# Patient Record
Sex: Female | Born: 1980 | Race: White | Hispanic: No | Marital: Single | Smoking: Never smoker
Health system: Southern US, Community
[De-identification: ages and names within clinical notes are randomized; demographics above are authoritative.]

## PROBLEM LIST (undated history)

## (undated) DIAGNOSIS — IMO0002 Reserved for concepts with insufficient information to code with codable children: Secondary | ICD-10-CM

## (undated) DIAGNOSIS — I10 Essential (primary) hypertension: Secondary | ICD-10-CM

## (undated) DIAGNOSIS — K219 Gastro-esophageal reflux disease without esophagitis: Secondary | ICD-10-CM

## (undated) HISTORY — PX: TONSILLECTOMY: SUR1361

## (undated) HISTORY — DX: Essential (primary) hypertension: I10

---

## 2008-12-30 ENCOUNTER — Emergency Department (HOSPITAL_COMMUNITY): Admission: EM | Admit: 2008-12-30 | Discharge: 2008-12-30 | Payer: Self-pay | Admitting: Family Medicine

## 2009-03-16 ENCOUNTER — Emergency Department (HOSPITAL_COMMUNITY): Admission: EM | Admit: 2009-03-16 | Discharge: 2009-03-16 | Payer: Self-pay | Admitting: Family Medicine

## 2009-07-07 ENCOUNTER — Emergency Department (HOSPITAL_BASED_OUTPATIENT_CLINIC_OR_DEPARTMENT_OTHER): Admission: EM | Admit: 2009-07-07 | Discharge: 2009-07-07 | Payer: Self-pay | Admitting: Emergency Medicine

## 2009-07-07 ENCOUNTER — Ambulatory Visit: Payer: Self-pay | Admitting: Radiology

## 2009-07-18 ENCOUNTER — Emergency Department (HOSPITAL_COMMUNITY): Admission: EM | Admit: 2009-07-18 | Discharge: 2009-07-18 | Payer: Self-pay | Admitting: Family Medicine

## 2010-03-23 ENCOUNTER — Ambulatory Visit (HOSPITAL_COMMUNITY): Payer: Self-pay | Admitting: Licensed Clinical Social Worker

## 2010-04-09 ENCOUNTER — Ambulatory Visit (HOSPITAL_COMMUNITY): Payer: Self-pay | Admitting: Licensed Clinical Social Worker

## 2010-04-16 ENCOUNTER — Ambulatory Visit (HOSPITAL_COMMUNITY): Payer: Self-pay | Admitting: Licensed Clinical Social Worker

## 2010-04-22 ENCOUNTER — Ambulatory Visit (HOSPITAL_COMMUNITY): Payer: Self-pay | Admitting: Psychiatry

## 2010-04-23 ENCOUNTER — Ambulatory Visit (HOSPITAL_COMMUNITY): Payer: Self-pay | Admitting: Licensed Clinical Social Worker

## 2010-04-30 ENCOUNTER — Ambulatory Visit (HOSPITAL_COMMUNITY): Payer: Self-pay | Admitting: Licensed Clinical Social Worker

## 2010-06-23 ENCOUNTER — Encounter (HOSPITAL_COMMUNITY): Payer: Self-pay | Admitting: Physician Assistant

## 2010-06-23 LAB — CBC: Platelets: 254 K/µL (ref 150–399)

## 2010-06-23 LAB — ABO/RH: RH Type: NEGATIVE

## 2010-06-23 LAB — HEPATITIS B SURFACE ANTIGEN: Hepatitis B Surface Ag: NEGATIVE

## 2010-06-23 LAB — RUBELLA ANTIBODY, IGM: Rubella: NON-IMMUNE/NOT IMMUNE

## 2010-08-03 LAB — POCT URINALYSIS DIP (DEVICE)
Ketones, ur: NEGATIVE mg/dL
Urobilinogen, UA: 0.2 mg/dL (ref 0.0–1.0)
pH: 7.5 (ref 5.0–8.0)

## 2011-01-31 ENCOUNTER — Encounter (HOSPITAL_COMMUNITY): Payer: Self-pay | Admitting: Anesthesiology

## 2011-01-31 ENCOUNTER — Inpatient Hospital Stay (HOSPITAL_COMMUNITY)
Admission: AD | Admit: 2011-01-31 | Discharge: 2011-02-02 | DRG: 775 | Disposition: A | Discharge status: Home / Self Care | Payer: 59 | Source: Ambulatory Visit | Attending: Obstetrics and Gynecology | Admitting: Obstetrics and Gynecology

## 2011-01-31 ENCOUNTER — Inpatient Hospital Stay (HOSPITAL_COMMUNITY): Payer: 59 | Admitting: Anesthesiology

## 2011-01-31 ENCOUNTER — Encounter (HOSPITAL_COMMUNITY): Payer: Self-pay | Admitting: *Deleted

## 2011-01-31 DIAGNOSIS — IMO0002 Reserved for concepts with insufficient information to code with codable children: Secondary | ICD-10-CM

## 2011-01-31 HISTORY — DX: Reserved for concepts with insufficient information to code with codable children: IMO0002

## 2011-01-31 HISTORY — DX: Gastro-esophageal reflux disease without esophagitis: K21.9

## 2011-01-31 LAB — CBC
HCT: 39.1 % (ref 36.0–46.0)
Hemoglobin: 13.2 g/dL (ref 12.0–15.0)
MCH: 30.3 pg (ref 26.0–34.0)
MCV: 89.7 fL (ref 78.0–100.0)
Platelets: 244 10*3/uL (ref 150–400)
RBC: 4.36 MIL/uL (ref 3.87–5.11)
WBC: 17.9 10*3/uL — ABNORMAL HIGH (ref 4.0–10.5)

## 2011-01-31 LAB — AMNISURE RUPTURE OF MEMBRANE (ROM) NOT AT ARMC: Amnisure ROM: POSITIVE

## 2011-01-31 MED ORDER — LACTATED RINGERS IV SOLN
INTRAVENOUS | Status: DC
  Administered 2011-01-31 – 2011-02-01 (×3): via INTRAVENOUS

## 2011-01-31 MED ORDER — BUTORPHANOL TARTRATE 2 MG/ML IJ SOLN
1.0000 mg | INTRAMUSCULAR | Status: DC | PRN
  Administered 2011-01-31 (×3): 1 mg via INTRAVENOUS
  Filled 2011-01-31 (×2): qty 1

## 2011-01-31 MED ORDER — FLEET ENEMA 7-19 GM/118ML RE ENEM: 1.0000 | ENEMA | RECTAL | Status: DC | PRN

## 2011-01-31 MED ORDER — FENTANYL 2.5 MCG/ML BUPIVACAINE 1/10 % EPIDURAL INFUSION (WH - ANES)
INTRAMUSCULAR | Status: DC | PRN
  Administered 2011-01-31: 16 mL/h via EPIDURAL

## 2011-01-31 MED ORDER — PHENYLEPHRINE 40 MCG/ML (10ML) SYRINGE FOR IV PUSH (FOR BLOOD PRESSURE SUPPORT)
80.0000 ug | PREFILLED_SYRINGE | INTRAVENOUS | Status: DC | PRN
  Filled 2011-01-31: qty 5

## 2011-01-31 MED ORDER — SODIUM CHLORIDE 0.9 % IV SOLN
2.0000 g | Freq: Four times a day (QID) | INTRAVENOUS | Status: DC
  Administered 2011-01-31 (×2): 2 g via INTRAVENOUS
  Filled 2011-01-31 (×5): qty 2000

## 2011-01-31 MED ORDER — LIDOCAINE HCL 1.5 % IJ SOLN
INTRAMUSCULAR | Status: DC | PRN
  Administered 2011-01-31 (×2): 5 mL via EPIDURAL

## 2011-01-31 MED ORDER — TERBUTALINE SULFATE 1 MG/ML IJ SOLN: 0.2500 mg | Freq: Once | INTRAMUSCULAR | Status: AC | PRN

## 2011-01-31 MED ORDER — OXYCODONE-ACETAMINOPHEN 5-325 MG PO TABS: 2.0000 | ORAL_TABLET | ORAL | Status: DC | PRN

## 2011-01-31 MED ORDER — IBUPROFEN 600 MG PO TABS: 600.0000 mg | ORAL_TABLET | Freq: Four times a day (QID) | ORAL | Status: DC | PRN

## 2011-01-31 MED ORDER — PHENYLEPHRINE 40 MCG/ML (10ML) SYRINGE FOR IV PUSH (FOR BLOOD PRESSURE SUPPORT)
80.0000 ug | PREFILLED_SYRINGE | INTRAVENOUS | Status: DC | PRN
  Filled 2011-01-31 (×2): qty 5

## 2011-01-31 MED ORDER — CITRIC ACID-SODIUM CITRATE 334-500 MG/5ML PO SOLN: 30.0000 mL | ORAL | Status: DC | PRN

## 2011-01-31 MED ORDER — EPHEDRINE 5 MG/ML INJ
10.0000 mg | INTRAVENOUS | Status: DC | PRN
  Filled 2011-01-31: qty 4

## 2011-01-31 MED ORDER — LACTATED RINGERS IV SOLN: 500.0000 mL | INTRAVENOUS | Status: DC | PRN

## 2011-01-31 MED ORDER — DIPHENHYDRAMINE HCL 50 MG/ML IJ SOLN: 12.5000 mg | INTRAMUSCULAR | Status: DC | PRN

## 2011-01-31 MED ORDER — SODIUM CHLORIDE 0.9 % IV SOLN
2.0000 g | Freq: Once | INTRAVENOUS | Status: AC
  Administered 2011-01-31: 2 g via INTRAVENOUS
  Filled 2011-01-31: qty 2000

## 2011-01-31 MED ORDER — LACTATED RINGERS IV SOLN: 500.0000 mL | Freq: Once | INTRAVENOUS | Status: DC

## 2011-01-31 MED ORDER — ACETAMINOPHEN 325 MG PO TABS
650.0000 mg | ORAL_TABLET | ORAL | Status: DC | PRN
  Administered 2011-01-31 (×2): 650 mg via ORAL
  Filled 2011-01-31 (×2): qty 2

## 2011-01-31 MED ORDER — EPHEDRINE 5 MG/ML INJ
10.0000 mg | INTRAVENOUS | Status: DC | PRN
  Administered 2011-01-31: 15 mg via INTRAVENOUS
  Filled 2011-01-31 (×2): qty 4

## 2011-01-31 MED ORDER — OXYTOCIN 20 UNITS IN LACTATED RINGERS INFUSION - SIMPLE
125.0000 mL/h | Freq: Once | INTRAVENOUS | Status: DC
  Filled 2011-01-31: qty 1000

## 2011-01-31 MED ORDER — LIDOCAINE HCL (PF) 1 % IJ SOLN
30.0000 mL | INTRAMUSCULAR | Status: DC | PRN
  Administered 2011-02-01: 30 mL via SUBCUTANEOUS
  Filled 2011-01-31: qty 30

## 2011-01-31 MED ORDER — OXYTOCIN BOLUS FROM INFUSION
500.0000 mL | Freq: Once | INTRAVENOUS | Status: DC
  Filled 2011-01-31: qty 500

## 2011-01-31 MED ORDER — FENTANYL 2.5 MCG/ML BUPIVACAINE 1/10 % EPIDURAL INFUSION (WH - ANES)
14.0000 mL/h | INTRAMUSCULAR | Status: DC
  Administered 2011-01-31 (×4): 14 mL/h via EPIDURAL
  Filled 2011-01-31 (×5): qty 60

## 2011-01-31 MED ORDER — SODIUM CHLORIDE 0.9 % IV SOLN: 1.0000 g | Freq: Four times a day (QID) | INTRAVENOUS | Status: DC

## 2011-01-31 MED ORDER — OXYTOCIN 20 UNITS IN LACTATED RINGERS INFUSION - SIMPLE
1.0000 m[IU]/min | INTRAVENOUS | Status: DC
  Administered 2011-01-31: 1 m[IU]/min via INTRAVENOUS
  Administered 2011-02-01: 999 m[IU]/min via INTRAVENOUS

## 2011-01-31 MED ORDER — ONDANSETRON HCL 4 MG/2ML IJ SOLN
4.0000 mg | Freq: Four times a day (QID) | INTRAMUSCULAR | Status: DC | PRN
  Administered 2011-01-31 (×2): 4 mg via INTRAVENOUS
  Filled 2011-01-31 (×2): qty 2

## 2011-01-31 NOTE — Progress Notes (Signed)
Assumed care of pt after report received.

## 2011-01-31 NOTE — Progress Notes (Signed)
FHT reactive UCs q 2-4 min Cx 3/90/-2 Check cx in 2 hrs

## 2011-01-31 NOTE — H&P (Signed)
Sandra [name] is a 30 y.o. female presenting for SROM at about 4 am, now with some UCs. No HA, vision change, epigastric pain. Maternal Medical History:  Reason for admission: Reason for admission: rupture of membranes.  Contractions: Onset was 3-5 hours ago.   Perceived severity is moderate.    Fetal activity: Perceived fetal activity is normal.      OB History    Grav Para Term Preterm Abortions TAB SAB Ect Mult Living   1              Past Medical History  Diagnosis Date  . GERD (gastroesophageal reflux disease)   . SROM (spontaneous rupture of membranes) 01/31/2011   Past Surgical History  Procedure Date  . Tonsillectomy    Family History: family history is not on file. Social History:  reports that she has never smoked. She has never used smokeless tobacco. She reports that she does not drink alcohol or use illicit drugs.  Review of Systems  Eyes: Negative for blurred vision.  Gastrointestinal: Negative for abdominal pain.  Neurological: Negative for headaches.    Dilation: 2 Effacement (%): 80 Station: -2 Exam by:: Dr. Henderson Cloud Blood pressure 134/79, pulse 91, temperature 98.7 F (37.1 C), temperature source Oral, resp. rate 18, height 5\' 11"  (1.803 m), weight 99.791 kg (220 lb), last menstrual period 04/29/2010. Maternal Exam:  Uterine Assessment: Contraction strength is firm.  Contraction frequency is irregular.      Fetal Exam Fetal Monitor Review: Pattern: accelerations present.       Physical Exam  Constitutional: She appears well-developed and well-nourished.  Cardiovascular: Normal rate and regular rhythm.   Respiratory: Effort normal and breath sounds normal.  GI: There is no tenderness.  Neurological: She has normal reflexes.    Prenatal labs: ABO, Rh:   Antibody:   Rubella:   RPR:    HBsAg:    HIV:    GBS:     Assessment/Plan: 30 yo WF G1P0 at 14 4/7 weeks with SROM. Will begin pitocin prn in 1 hour  Arilla Hice II,Antaeus Karel E 01/31/2011,  8:15 AM

## 2011-01-31 NOTE — Progress Notes (Signed)
Pt reports uc's for the last 3 days. Pt reports vag. Bleeding about 04:00

## 2011-01-31 NOTE — Progress Notes (Signed)
Notified of amnisure positive and SVE. Order to admit pt to L&D

## 2011-01-31 NOTE — Anesthesia Procedure Notes (Signed)
Epidural Patient location during procedure: OB Start time: 01/31/2011 1:20 PM  Staffing Anesthesiologist: Meliah Appleman A. Performed by: anesthesiologist   Preanesthetic Checklist Completed: patient identified, site marked, surgical consent, pre-op evaluation, timeout performed, IV checked, risks and benefits discussed and monitors and equipment checked  Epidural Patient position: sitting Prep: site prepped and draped and DuraPrep Patient monitoring: continuous pulse ox and blood pressure Approach: midline Injection technique: LOR air  Needle:  Needle type: Tuohy  Needle gauge: 17 G Needle length: 9 cm Needle insertion depth: 5 cm cm Catheter type: closed end flexible Catheter size: 19 Gauge Catheter at skin depth: 10 cm Test dose: negative and 1.5% lidocaine  Assessment Events: blood not aspirated, injection not painful, no injection resistance, negative IV test and no paresthesia  Additional Notes Patient is more comfortable after epidural dosed. Please see RN's note for documentation of vital signs and FHR which are stable.

## 2011-01-31 NOTE — Anesthesia Preprocedure Evaluation (Addendum)
Anesthesia Evaluation  Name, MR# and DOB Patient awake  General Assessment Comment  Reviewed: Allergy & Precautions, H&P , Patient's Chart, lab work & pertinent test results  Airway Mallampati: I TM Distance: >3 FB Neck ROM: full    Dental No notable dental hx. (+) Teeth Intact   Pulmonary  clear to auscultation  pulmonary exam normalPulmonary Exam Normal breath sounds clear to auscultation none    Cardiovascular regular Normal    Neuro/Psych Negative Neurological ROS  Negative Psych ROS  GI/Hepatic/Renal negative GI ROS  negative Liver ROS  negative Renal ROS        Endo/Other  Negative Endocrine ROS (+)      Abdominal   Musculoskeletal   Hematology negative hematology ROS (+)   Peds  Reproductive/Obstetrics (+) Pregnancy    Anesthesia Other Findings            Anesthesia Physical Anesthesia Plan  ASA: II  Anesthesia Plan: Epidural   Post-op Pain Management:    Induction:   Airway Management Planned:   Additional Equipment:   Intra-op Plan:   Post-operative Plan:   Informed Consent: I have reviewed the patients History and Physical, chart, labs and discussed the procedure including the risks, benefits and alternatives for the proposed anesthesia with the patient or authorized representative who has indicated his/her understanding and acceptance.     Plan Discussed with: Anesthesiologist  Anesthesia Plan Comments:         Anesthesia Quick Evaluation

## 2011-01-31 NOTE — Progress Notes (Signed)
FHT reactive UCs q 3-4 minutes Cx 3-4/90/-2 IUPC placed-gush of mec stained fluid-thin Epidural in Pitocin on D/W pt meconium

## 2011-02-01 ENCOUNTER — Encounter (HOSPITAL_COMMUNITY): Payer: Self-pay | Admitting: Obstetrics and Gynecology

## 2011-02-01 LAB — ABO/RH: ABO/RH(D): O NEG

## 2011-02-01 MED ORDER — LANOLIN HYDROUS EX OINT: TOPICAL_OINTMENT | CUTANEOUS | Status: DC | PRN

## 2011-02-01 MED ORDER — DIPHENHYDRAMINE HCL 25 MG PO CAPS: 25.0000 mg | ORAL_CAPSULE | Freq: Four times a day (QID) | ORAL | Status: DC | PRN

## 2011-02-01 MED ORDER — ONDANSETRON HCL 4 MG PO TABS
4.0000 mg | ORAL_TABLET | ORAL | Status: DC | PRN
  Administered 2011-02-01: 4 mg via ORAL
  Filled 2011-02-01: qty 1

## 2011-02-01 MED ORDER — ONDANSETRON HCL 4 MG/2ML IJ SOLN: 4.0000 mg | INTRAMUSCULAR | Status: DC | PRN

## 2011-02-01 MED ORDER — SENNOSIDES-DOCUSATE SODIUM 8.6-50 MG PO TABS
2.0000 | ORAL_TABLET | Freq: Every day | ORAL | Status: DC
  Administered 2011-02-01: 2 via ORAL

## 2011-02-01 MED ORDER — BENZOCAINE-MENTHOL 20-0.5 % EX AERO
INHALATION_SPRAY | CUTANEOUS | Status: AC
  Administered 2011-02-01: 1 via TOPICAL
  Filled 2011-02-01: qty 56

## 2011-02-01 MED ORDER — OXYCODONE-ACETAMINOPHEN 5-325 MG PO TABS
1.0000 | ORAL_TABLET | ORAL | Status: DC | PRN
  Administered 2011-02-01 – 2011-02-02 (×4): 1 via ORAL
  Administered 2011-02-02: 2 via ORAL
  Filled 2011-02-01 (×4): qty 1
  Filled 2011-02-01: qty 2

## 2011-02-01 MED ORDER — WITCH HAZEL-GLYCERIN EX PADS: 1.0000 "application " | MEDICATED_PAD | CUTANEOUS | Status: DC | PRN

## 2011-02-01 MED ORDER — BISACODYL 10 MG RE SUPP: 10.0000 mg | Freq: Every day | RECTAL | Status: DC | PRN

## 2011-02-01 MED ORDER — BENZOCAINE-MENTHOL 20-0.5 % EX AERO
1.0000 "application " | INHALATION_SPRAY | CUTANEOUS | Status: DC | PRN
  Administered 2011-02-01: 1 via TOPICAL

## 2011-02-01 MED ORDER — ZOLPIDEM TARTRATE 5 MG PO TABS: 5.0000 mg | ORAL_TABLET | Freq: Every evening | ORAL | Status: DC | PRN

## 2011-02-01 MED ORDER — SIMETHICONE 80 MG PO CHEW: 80.0000 mg | CHEWABLE_TABLET | ORAL | Status: DC | PRN

## 2011-02-01 MED ORDER — PRENATAL PLUS 27-1 MG PO TABS
1.0000 | ORAL_TABLET | Freq: Every day | ORAL | Status: DC
  Administered 2011-02-01: 1 via ORAL
  Filled 2011-02-01 (×2): qty 1

## 2011-02-01 MED ORDER — TETANUS-DIPHTH-ACELL PERTUSSIS 5-2.5-18.5 LF-MCG/0.5 IM SUSP: 0.5000 mL | Freq: Once | INTRAMUSCULAR | Status: DC

## 2011-02-01 MED ORDER — FLEET ENEMA 7-19 GM/118ML RE ENEM: 1.0000 | ENEMA | RECTAL | Status: DC | PRN

## 2011-02-01 MED ORDER — IBUPROFEN 600 MG PO TABS
600.0000 mg | ORAL_TABLET | Freq: Four times a day (QID) | ORAL | Status: DC
  Administered 2011-02-01 – 2011-02-02 (×5): 600 mg via ORAL
  Filled 2011-02-01 (×4): qty 1

## 2011-02-01 MED ORDER — DIBUCAINE 1 % RE OINT: 1.0000 "application " | TOPICAL_OINTMENT | RECTAL | Status: DC | PRN

## 2011-02-01 NOTE — Anesthesia Postprocedure Evaluation (Signed)
  Anesthesia Post-op Note  Patient: Sandra [name]  Procedure(s) Performed: * No procedures listed *  Patient Location: PACU and Mother/Baby  Anesthesia Type: Spinal  Level of Consciousness: awake, alert , oriented and patient cooperative  Airway and Oxygen Therapy: Patient Spontanous Breathing  Post-op Pain: mild  Post-op Assessment: Post-op Vital signs reviewed, Patient's Cardiovascular Status Stable, Respiratory Function Stable, Patent Airway, NAUSEA AND VOMITING PRESENT, Adequate PO intake and Pain level controlled  Post-op Vital Signs: Reviewed and stable  Complications: No apparent anesthesia complications

## 2011-02-01 NOTE — Progress Notes (Signed)
Delivery Note Pushing about 1.5 hrs, FHT reactive SVD VFI  Apgars 9/9, Wt 9# 4oz Peds present Placenta 3 vessels , intact, to Path EBL 400cc Bilat second degree periurethral lacs-repaired Lt vaginal sidewall hematoma 3-4 cm-observed-stable Smaller hematoma on rt also stable Pt / infant stable in LDR

## 2011-02-01 NOTE — Progress Notes (Signed)
Post Partum Day 0 Subjective: no complaints  Objective: Blood pressure 121/63, pulse 104, temperature 98.4 F (36.9 C), temperature source Oral, resp. rate 20, height 5\' 11"  (1.803 m), weight 99.791 kg (220 lb), last menstrual period 04/29/2010, SpO2 97.00%, unknown if currently breastfeeding.  Physical Exam:  General: alert Lochia: appropriate Uterine Fundus: firm Incision: none DVT Evaluation: No evidence of DVT seen on physical exam.   Basename 01/31/11 0755  HGB 13.2  HCT 39.1    Assessment/Plan: Breastfeeding and Lactation consult   LOS: 1 day   Kiosha Buchan L 02/01/2011, 7:36 AM

## 2011-02-01 NOTE — Anesthesia Postprocedure Evaluation (Signed)
  Anesthesia Post-op Note  Patient: Sandra [name]  Procedure(s) Performed: * Lumbar Epidural for L&D *  Patient Location: PACU and Mother/Baby  Anesthesia Type: Epidural  Level of Consciousness: awake, alert  and oriented  Airway and Oxygen Therapy: Patient Spontanous Breathing  Post-op Pain: none  Post-op Assessment: Post-op Vital signs reviewed, Patient's Cardiovascular Status Stable, Respiratory Function Stable, Patent Airway, No signs of Nausea or vomiting, Adequate PO intake, Pain level controlled, No headache, No backache, No residual numbness and No residual motor weakness  Post-op Vital Signs: Reviewed and stable  Complications: No apparent anesthesia complications

## 2011-02-02 LAB — CBC
HCT: 35.3 % — ABNORMAL LOW (ref 36.0–46.0)
MCHC: 32.9 g/dL (ref 30.0–36.0)
Platelets: 207 10*3/uL (ref 150–400)
RDW: 14.2 % (ref 11.5–15.5)
WBC: 16.2 10*3/uL — ABNORMAL HIGH (ref 4.0–10.5)

## 2011-02-02 MED ORDER — IBUPROFEN 600 MG PO TABS: 600.0000 mg | ORAL_TABLET | Freq: Four times a day (QID) | ORAL | Status: AC

## 2011-02-02 MED ORDER — OXYCODONE-ACETAMINOPHEN 5-325 MG PO TABS: 1.0000 | ORAL_TABLET | ORAL | Status: AC | PRN

## 2011-02-02 NOTE — Discharge Summary (Signed)
Obstetric Discharge Summary Reason for Admission: rupture of membranes Prenatal Procedures: none Intrapartum Procedures: spontaneous vaginal delivery Postpartum Procedures: none Complications-Operative and Postpartum: periurethral lac Hemoglobin  Date Value Range Status  02/02/2011 11.6* 12.0-15.0 (g/dL) Final     HCT  Date Value Range Status  02/02/2011 35.3* 36.0-46.0 (%) Final    Discharge Diagnoses: Term Pregnancy-delivered  Discharge Information: Date: 02/02/2011 Activity: pelvic rest Diet: routine Medications: PNV, Ibuprofen and Percocet Condition: stable Instructions: refer to practice specific booklet Discharge to: home   Newborn Data: Live born female  Birth Weight: 9 lb 4.7 oz (4216 g) APGAR: 8, 9  Home with mother.  CURTIS,CAROL G 02/02/2011, 8:47 AM

## 2011-02-02 NOTE — Progress Notes (Signed)
Post Partum Day 1 Subjective: no complaints, up ad lib and desires early discharge  Objective: Blood pressure 114/74, pulse 82, temperature 98 F (36.7 C), temperature source Oral, resp. rate 18, height 5\' 11"  (1.803 m), weight 99.791 kg (220 lb), last menstrual period 04/29/2010, SpO2 97.00%, unknown if currently breastfeeding.  Physical Exam:  General: alert and cooperative Lochia: appropriate Uterine Fundus: firm Perineum intact DVT Evaluation: no evidence of DVT   Basename 02/02/11 0542 01/31/11 0755  HGB 11.6* 13.2  HCT 35.3* 39.1    Assessment/Plan: Discharge home   LOS: 2 days   CURTIS,CAROL G 02/02/2011, 8:40 AM

## 2011-02-08 ENCOUNTER — Encounter (HOSPITAL_COMMUNITY): Payer: 59

## 2011-02-11 ENCOUNTER — Ambulatory Visit (HOSPITAL_COMMUNITY)
Admission: RE | Admit: 2011-02-11 | Discharge: 2011-02-11 | Disposition: A | Discharge status: Home / Self Care | Payer: 59 | Source: Ambulatory Visit | Attending: Obstetrics and Gynecology | Admitting: Obstetrics and Gynecology

## 2011-02-11 NOTE — Progress Notes (Signed)
Adult Lactation Consultation Outpatient Visit Note  Patient Name: Sandra [name] [name] Date of Birth: [birth year] Gestational Age at Delivery: [redacted]w[redacted]d Type of Delivery:   Breastfeeding History: Frequency of Breastfeeding: Every 3 hours Length of Feeding: up to an hour Voids: 6 Stools: 6  Supplementing / Method: Pumping:  Type of Pump:PIS   Frequency: PRN  Volume:  3+oz  Comments:  Sandra [name] [name] is here today because of burning breast pain on the R side with latch and throughout feeding. Began about 4 days ago.  She denies fever.  R breast also has erythema radiating approx 8 cm from the nipple toward the chest wall.  It circumscribes the breast.   Bilateral central inversion of nipples that burn and are pink .  The R nipple has small healing abrasions on it.  The L nipple has a crack in it.  Infant latch appears appropriate but is very painful on the right breast.  The left breast has minimal discomfort with latch.  History is suggestive of bacterial infection.  Discussed with Dr Candice Camp.  He wrote a prescription for her for Keflex and Lotrisone.  Will follow up with OB is no improvement in 24 to 48 hours.  She plans to feed on L and pump right 6 times daily until she can tolerate latching again.    Consultation Evaluation:  Initial Feeding Assessment: Pre-feed Weight: 9+2.3 Post-feed Weight: 9+4.2 Amount Transferred: 59ml Comments:  Additional Feeding Assessment: Pre-feed Weight: Post-feed Weight: Amount Transferred: Comments:  Additional Feeding Assessment: Pre-feed Weight: Post-feed Weight: Amount Transferred: Comments:  Total Breast milk Transferred this Visit:  Total Supplement Given:   Additional Interventions:   Follow-Up      Sandra [name], [name] 02/11/2011, 8:53 PM

## 2011-08-20 ENCOUNTER — Emergency Department (HOSPITAL_COMMUNITY)
Admission: EM | Admit: 2011-08-20 | Discharge: 2011-08-20 | Disposition: A | Discharge status: Home / Self Care | Payer: 59 | Source: Home / Self Care | Attending: Emergency Medicine | Admitting: Emergency Medicine

## 2011-08-20 ENCOUNTER — Encounter (HOSPITAL_COMMUNITY): Payer: Self-pay | Admitting: Emergency Medicine

## 2011-08-20 DIAGNOSIS — J029 Acute pharyngitis, unspecified: Secondary | ICD-10-CM

## 2011-08-20 DIAGNOSIS — H11439 Conjunctival hyperemia, unspecified eye: Secondary | ICD-10-CM

## 2011-08-20 LAB — POCT RAPID STREP A: Streptococcus, Group A Screen (Direct): NEGATIVE

## 2011-08-20 MED ORDER — CIPROFLOXACIN HCL 0.3 % OP SOLN: 1.0000 [drp] | Freq: Two times a day (BID) | OPHTHALMIC | Status: AC

## 2011-08-20 NOTE — Discharge Instructions (Signed)
   As discussed your current symptoms and exam were consistent with a viral process. And uses antibiotic eyedrops for mainly prevention of a secondary bacterial infection of your lower eyelid. Use Tylenol or Motrin for discomfort and control. Monitor your temperatures in the next 24-48 hours frequently    Pharyngitis, Viral and Bacterial Pharyngitis is soreness (inflammation) or infection of the pharynx. It is also called a sore throat. CAUSES  Most sore throats are caused by viruses and are part of a cold. However, some sore throats are caused by strep and other bacteria. Sore throats can also be caused by post nasal drip from draining sinuses, allergies and sometimes from sleeping with an open mouth. Infectious sore throats can be spread from person to person by coughing, sneezing and sharing cups or eating utensils. TREATMENT  Sore throats that are viral usually last 3-4 days. Viral illness will get better without medications (antibiotics). Strep throat and other bacterial infections will usually begin to get better about 24-48 hours after you begin to take antibiotics. HOME CARE INSTRUCTIONS   If the caregiver feels there is a bacterial infection or if there is a positive strep test, they will prescribe an antibiotic. The full course of antibiotics must be taken. If the full course of antibiotic is not taken, you or your child may become ill again. If you or your child has strep throat and do not finish all of the medication, serious heart or kidney diseases may develop.   Drink enough water and fluids to keep your urine clear or pale yellow.   Only take over-the-counter or prescription medicines for pain, discomfort or fever as directed by your caregiver.   Get lots of rest.   Gargle with salt water ( tsp. of salt in a glass of water) as often as every 1-2 hours as you need for comfort.   Hard candies may soothe the throat if individual is not at risk for choking. Throat sprays or  lozenges may also be used.  SEEK MEDICAL CARE IF:   Large, tender lumps in the neck develop.   A rash develops.   Green, yellow-brown or bloody sputum is coughed up.   Your baby is older than 3 months with a rectal temperature of 100.5 F (38.1 C) or higher for more than 1 day.  SEEK IMMEDIATE MEDICAL CARE IF:   A stiff neck develops.   You or your child are drooling or unable to swallow liquids.   You or your child are vomiting, unable to keep medications or liquids down.   You or your child has severe pain, unrelieved with recommended medications.   You or your child are having difficulty breathing (not due to stuffy nose).   You or your child are unable to fully open your mouth.   You or your child develop redness, swelling, or severe pain anywhere on the neck.   You have a fever.   Your baby is older than 3 months with a rectal temperature of 102 F (38.9 C) or higher.   Your baby is 48 months old or younger with a rectal temperature of 100.4 F (38 C) or higher.  MAKE SURE YOU:   Understand these instructions.   Will watch your condition.   Will get help right away if you are not doing well or get worse.  Document Released: 04/26/2005 Document Revised: 04/15/2011 Document Reviewed: 07/24/2007 [facility] Patient Information 2012 [place], [name].

## 2011-08-20 NOTE — ED Notes (Signed)
PT HERE WITH LEFT PINK EYE AND DRAINAGE AND SORE THROAT.SX STARTED LAST NIGHT .DENIES FEVER,CHILLS,N,V

## 2011-08-20 NOTE — ED Provider Notes (Signed)
History     CSN: [id number]  Arrival date & time 08/20/11  1322   First MD Initiated Contact with Patient 08/20/11 1325      Chief Complaint  Patient presents with  . Conjunctivitis  . Sore Throat    (Consider location/radiation/quality/duration/timing/severity/associated sxs/prior treatment) HPI Comments: Patient presents with the new onset of left eye redness and drainage with a sore throat that started last night. Patient denies any other symptoms including, fever, nasal congestion, cough, any vomiting or diarrhea as. Patient describes that earlier she did see a rash in her lower abdomen it was unusual-looking since then has faded at this point  Patient is a 31 y.o. female presenting with conjunctivitis and pharyngitis.  Conjunctivitis  The current episode started yesterday. The problem has been gradually worsening. The problem is mild. Associated symptoms include sore throat, rash, eye discharge and eye redness. Pertinent negatives include no fever, no decreased vision, no double vision, no abdominal pain, no constipation, no diarrhea, no nausea, no vomiting, no congestion, no ear pain, no headaches, no hearing loss, no rhinorrhea, no stridor, no swollen glands, no muscle aches, no neck pain, no neck stiffness, no cough, no URI and no wheezing.  Sore Throat Pertinent negatives include no abdominal pain and no headaches.    Past Medical History  Diagnosis Date  . GERD (gastroesophageal reflux disease)   . SROM (spontaneous rupture of membranes) 01/31/2011  . Spontaneous vaginal delivery 02/01/2011    Past Surgical History  Procedure Date  . Tonsillectomy     No family history on file.  History  Substance Use Topics  . Smoking status: Never Smoker   . Smokeless tobacco: Never Used  . Alcohol Use: No    OB History    Grav Para Term Preterm Abortions TAB SAB Ect Mult Living   1 1 1       1       Review of Systems  Constitutional: Negative for fever, chills and  activity change.  HENT: Positive for sore throat. Negative for hearing loss, ear pain, congestion, facial swelling, rhinorrhea and neck pain.   Eyes: Positive for discharge and redness. Negative for double vision and visual disturbance.  Respiratory: Negative for cough, wheezing and stridor.   Gastrointestinal: Negative for nausea, vomiting, abdominal pain, diarrhea and constipation.  Genitourinary: Negative for dysuria.  Skin: Positive for rash.  Neurological: Negative for headaches.    Allergies  Sulfa antibiotics  Home Medications   Current Outpatient Rx  Name Route Sig Dispense Refill  . CALCIUM CARBONATE-VITAMIN D 500-200 MG-UNIT PO TABS Oral Take 1 tablet by mouth daily.      Marland Kitchen CIPROFLOXACIN HCL 0.3 % OP SOLN Left Eye Place 1 drop into the left eye 2 (two) times daily. Administer 1 drop, every 12 hours for 7 days 5 mL 0  . FAMOTIDINE 10 MG PO TABS Oral Take 10 mg by mouth daily.      Marland Kitchen PRENATAL PLUS 27-1 MG PO TABS Oral Take 1 tablet by mouth daily.        BP 147/86  Pulse 101  Temp(Src) 98.8 F (37.1 C) (Oral)  Resp 14  SpO2 99%  LMP 07/21/2011  Breastfeeding? Yes  Physical Exam  Nursing note and vitals reviewed. Constitutional: She appears well-developed and well-nourished.  HENT:  Head: Normocephalic.  Mouth/Throat: Uvula is midline and mucous membranes are normal. Posterior oropharyngeal erythema present. No oropharyngeal exudate, posterior oropharyngeal edema or tonsillar abscesses.  Eyes: Left eye exhibits no discharge, no exudate  and no hordeolum. No foreign body present in the left eye. Left conjunctiva is injected. Left conjunctiva has no hemorrhage. No scleral icterus. Left eye exhibits normal extraocular motion. Left pupil is round and reactive.    Lymphadenopathy:    She has no cervical adenopathy.  Skin: Skin is warm. No abrasion, no ecchymosis, no laceration, no lesion, no petechiae, no purpura and no rash noted. She is not diaphoretic. No erythema.     ED Course  Procedures (including critical care time)   Labs Reviewed  POCT RAPID STREP A ([facility] CARE ONLY)   No results found.   1. Pharyngitis   2. Conjunctival injection       MDM  Patient with left high conjunctival hyperemia and mild blepharitis of left lower eyelid. Coexistent with pharyngitis. Patient described a rash in her lower abdomen that has since then cleared. Child has been active virus infection she 7 diarrheas. Request today strep test which was negative. Have encouraged her to monitor temperatures today as I suspect this conjunctivitis and pharyngitis or a viral respiratory process, did prescribe antibiotic for prevention of lower eyelid blepharitis.        Jimmie Molly, MD 08/20/11 [phone]

## 2012-01-04 ENCOUNTER — Encounter: Payer: Self-pay | Admitting: Internal Medicine

## 2012-01-04 ENCOUNTER — Ambulatory Visit (INDEPENDENT_AMBULATORY_CARE_PROVIDER_SITE_OTHER): Payer: 59 | Admitting: Internal Medicine

## 2012-01-04 VITALS — BP 138/90 | Temp 98.2°F | Ht 71.0 in | Wt 174.0 lb

## 2012-01-04 DIAGNOSIS — R350 Frequency of micturition: Secondary | ICD-10-CM

## 2012-01-04 DIAGNOSIS — F5104 Psychophysiologic insomnia: Secondary | ICD-10-CM

## 2012-01-04 DIAGNOSIS — G4700 Insomnia, unspecified: Secondary | ICD-10-CM

## 2012-01-04 DIAGNOSIS — Z0000 Encounter for general adult medical examination without abnormal findings: Secondary | ICD-10-CM

## 2012-01-04 DIAGNOSIS — R030 Elevated blood-pressure reading, without diagnosis of hypertension: Secondary | ICD-10-CM

## 2012-01-04 MED ORDER — ZOLPIDEM TARTRATE 10 MG PO TABS: 10.0000 mg | ORAL_TABLET | Freq: Every evening | ORAL | Status: DC | PRN

## 2012-01-04 MED ORDER — FLUOXETINE HCL 10 MG PO TABS: 10.0000 mg | ORAL_TABLET | Freq: Every day | ORAL | Status: DC

## 2012-01-04 NOTE — Patient Instructions (Addendum)
Our office will contact you re: urology referral

## 2012-01-04 NOTE — Assessment & Plan Note (Addendum)
Patient advised to monitor her blood pressure at home 3 times per week. We will review her blood pressure log at next office visit. BP: 138/90 mmHg

## 2012-01-04 NOTE — Assessment & Plan Note (Signed)
Patient has unexplained urinary urgency and nocturia.  Refer to urology for further work up.  Arrange urinalysis.

## 2012-01-04 NOTE — Progress Notes (Signed)
Subjective:    Patient ID: Sandra [name], female    DOB: Feb 25, 1981, 31 y.o.   MRN: [id number]  HPI  31 year old white female to establish. Patient's previous primary care physician was in [place]. She has been fairly healthy most of her life. She denies chronic illnesses. Patient has 53-month-old daughter. She had normal vaginal delivery. No complications during her pregnancy.    Patient complains of chronic insomnia. This has been going on for many years. Her symptoms exacerbated by recent birth of her daughter. 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  Her fiance is a Psychologist, occupational.  She has difficulty getting to sleep and staying asleep.  She has tried over-the-counter melatonin and Benadryl without improvement. She drinks one cup of coffee in the morning and has a diet Coke in the afternoon.  Patient denies any chronic heartburn symptoms. She does have chronic nocturia. She has history of urinary urgency.  She denies history of depression but admits to mild anxiety symptoms.  She has hx of PMDD and used fluoxetine in the past.  Review of Systems  Constitutional: Negative for activity change, appetite change and unexpected weight change.  Eyes: Negative for visual disturbance.  Respiratory: Negative for cough, chest tightness and shortness of breath.   Cardiovascular: Negative for chest pain.  Genitourinary: nocturia  Neurological: Negative for headaches.  Gastrointestinal: Negative for abdominal pain, heartburn melena or hematochezia Psych: Negative for depression   Past Medical History  Diagnosis Date  . GERD (gastroesophageal reflux disease)   . SROM (spontaneous rupture of membranes) 01/31/2011  . Spontaneous vaginal delivery 02/01/2011    History   Social History  . Marital Status: Single    Spouse Name: N/A    Number of Children: N/A  . Years of Education: N/A   Occupational History  . Not on file.   Social History Main Topics  . Smoking  status: Never Smoker   . Smokeless tobacco: Never Used  . Alcohol Use: No  . Drug Use: No  . Sexually Active: Yes    Birth Control/ Protection: None   Other Topics Concern  . Not on file   Social History Narrative  . No narrative on file    Past Surgical History  Procedure Date  . Tonsillectomy     No family history on file.  Allergies  Allergen Reactions  . Sulfa Antibiotics Rash    Current Outpatient Prescriptions on File Prior to Visit  Medication Sig Dispense Refill  . calcium-vitamin D (OS-CAL 500 + D) 500-200 MG-UNIT per tablet Take 1 tablet by mouth daily.        . famotidine (PEPCID) 10 MG tablet Take 10 mg by mouth daily.        Marland Kitchen FLUoxetine (PROZAC) 10 MG tablet Take 1 tablet (10 mg total) by mouth daily.  30 tablet  2  . zolpidem (AMBIEN) 10 MG tablet Take 1 tablet (10 mg total) by mouth at bedtime as needed for sleep.  30 tablet  1    BP 138/90  Temp 98.2 F (36.8 C) (Oral)  Ht 5\' 11"  (1.803 m)  Wt 174 lb (78.926 kg)  BMI 24.27 kg/m2      Objective:   Physical Exam  Constitutional: She appears well-developed and well-nourished. No distress.  HENT:  Head: Normocephalic and atraumatic.  Right Ear: External ear normal.  Left Ear: External ear normal.  Mouth/Throat: Oropharynx is clear and moist.  Eyes: EOM are normal. Pupils are equal, round, and reactive to  light.  Neck: Neck supple.  Cardiovascular: Normal rate, regular rhythm and normal heart sounds.   No murmur heard. Pulmonary/Chest: Effort normal and breath sounds normal. She has no wheezes.  Abdominal: Soft. Bowel sounds are normal. She exhibits no mass. There is no tenderness.  Musculoskeletal: She exhibits no edema.  Lymphadenopathy:    She has no cervical adenopathy.  Neurological: No cranial nerve deficit.  Skin: Skin is warm and dry.  Psychiatric: She has a normal mood and affect. Her behavior is normal.          Assessment & Plan:

## 2012-01-04 NOTE — Assessment & Plan Note (Addendum)
31 year old white female with chronic insomnia. Her symptoms exacerbated by life stressors. I suspect she may have mild underlying anxiety disorder. Trial of low-dose Fluoxetine 10 mg once daily. Use zolpidem 10 mg sparingly.  Sleep hygiene handout provided.

## 2012-03-06 ENCOUNTER — Ambulatory Visit (INDEPENDENT_AMBULATORY_CARE_PROVIDER_SITE_OTHER): Payer: 59 | Admitting: Internal Medicine

## 2012-03-06 ENCOUNTER — Encounter: Payer: Self-pay | Admitting: Internal Medicine

## 2012-03-06 VITALS — BP 122/80 | HR 76 | Temp 98.1°F | Wt 173.0 lb

## 2012-03-06 DIAGNOSIS — G4700 Insomnia, unspecified: Secondary | ICD-10-CM

## 2012-03-06 DIAGNOSIS — F5104 Psychophysiologic insomnia: Secondary | ICD-10-CM

## 2012-03-06 DIAGNOSIS — R222 Localized swelling, mass and lump, trunk: Secondary | ICD-10-CM | POA: Insufficient documentation

## 2012-03-06 DIAGNOSIS — R030 Elevated blood-pressure reading, without diagnosis of hypertension: Secondary | ICD-10-CM

## 2012-03-06 DIAGNOSIS — R599 Enlarged lymph nodes, unspecified: Secondary | ICD-10-CM

## 2012-03-06 MED ORDER — DOXYCYCLINE HYCLATE 100 MG PO TABS: 100.0000 mg | ORAL_TABLET | Freq: Two times a day (BID) | ORAL | Status: DC

## 2012-03-06 MED ORDER — TRAZODONE HCL 50 MG PO TABS: 50.0000 mg | ORAL_TABLET | Freq: Every day | ORAL | Status: DC

## 2012-03-06 NOTE — Assessment & Plan Note (Signed)
Patient could not tolerate fluoxetine due to headache. Trial of trazodone 50 mg by mouth at bedtime as needed. If poor response, we discussed trying Lunesta 2 mg at bedtime.

## 2012-03-06 NOTE — Assessment & Plan Note (Signed)
Home blood pressure readings are normal.

## 2012-03-06 NOTE — Progress Notes (Signed)
Subjective:    Patient ID: Sandra [name], female    DOB: 21-Apr-1981, 31 y.o.   MRN: [id number]  HPI  31 year old white female for followup regarding hypertension and chronic insomnia. Patient was tried on fluoxetine 10 mg. It was thought her insomnia may be based upon chronic anxiety disorder. Patient discontinued fluoxetine due to side effect of headache. She is still using zolpidem 10 mg as needed.  She has been monitoring her blood pressure at home. Her systolic readings are between 952 - 120. Her diastolic blood pressures are less than 80.  Patient also is concerned about small lump she found right base of her neck. It has been there for approximately 2 months. She does not report any specific trigger. Area is slightly tender. She denies any changes in appetite or her weight. She denies any recent dental issues or sinus infections.  Review of Systems See HPI  Past Medical History  Diagnosis Date  . GERD (gastroesophageal reflux disease)   . SROM (spontaneous rupture of membranes) 01/31/2011  . Spontaneous vaginal delivery 02/01/2011    History   Social History  . Marital Status: Single    Spouse Name: N/A    Number of Children: N/A  . 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 Smoking status: Never Smoker   . Smokeless tobacco: Never Used  . Alcohol Use: No  . Drug Use: No  . Sexually Active: Yes    Birth Control/ Protection: None   Other Topics Concern  . Not on file   Social History Narrative  . No narrative on file    Past Surgical History  Procedure Date  . Tonsillectomy     Family History  Problem Relation Age of Onset  . Hypertension Father   . Diabetes type II Father     Mild diabetes/prediabetes  . Breast cancer Paternal Aunt   . Multiple myeloma Maternal Grandmother     Allergies  Allergen Reactions  . Fluoxetine     Headache   . Sulfa Antibiotics Rash    Current  Outpatient Prescriptions on File Prior to Visit  Medication Sig Dispense Refill  . famotidine (PEPCID) 10 MG tablet Take 10 mg by mouth daily.        Marland Kitchen DISCONTD: zolpidem (AMBIEN) 10 MG tablet Take 1 tablet (10 mg total) by mouth at bedtime as needed for sleep.  30 tablet  1  . traZODone (DESYREL) 50 MG tablet Take 1 tablet (50 mg total) by mouth at bedtime.  30 tablet  1    BP 142/90  Pulse 76  Temp 98.1 F (36.7 C) (Oral)  Wt 173 lb (78.472 kg)       Objective:   Physical Exam  Constitutional: She is oriented to person, place, and time. She appears well-developed and well-nourished.  HENT:  Head: Normocephalic and atraumatic.  Right Ear: External ear normal.  Left Ear: External ear normal.  Mouth/Throat: Oropharynx is clear and moist.  Eyes: EOM are normal. Pupils are equal, round, and reactive to light.  Neck: Normal range of motion.       8 mm slightly firm nodule right lower neck  Cardiovascular: Normal rate, regular rhythm and normal heart sounds.   Pulmonary/Chest: Effort normal. She has no wheezes.  Abdominal: Soft. Bowel sounds are normal.       No hepatosplenomegaly  Neurological: She is alert and oriented to person, place, and time. No cranial  nerve deficit.          Assessment & Plan:

## 2012-03-06 NOTE — Assessment & Plan Note (Signed)
30 year old white female concerned about slightly firm lymph node base of her right neck.  No other cervical lymphadenopathy appreciated. I suspect lymph node is reactive in nature. Trial of doxycycline 100 mg twice daily for 10 days. Reassess in 2 weeks. If lymph node gets larger we discussed referral to ENT for further evaluation.

## 2012-03-08 ENCOUNTER — Ambulatory Visit: Payer: 59 | Admitting: Internal Medicine

## 2012-03-15 ENCOUNTER — Telehealth: Payer: Self-pay | Admitting: Family Medicine

## 2012-03-15 DIAGNOSIS — R221 Localized swelling, mass and lump, neck: Secondary | ICD-10-CM

## 2012-03-15 NOTE — Telephone Encounter (Signed)
Pt wants to know if you could just do an ultrasound.  She said if you recommend the ENT then she will go but she would rather have an ultrasound first

## 2012-03-15 NOTE — Telephone Encounter (Signed)
Refer to ENT - Dr. Annalee Genta re: right lower neck nodule

## 2012-03-15 NOTE — Telephone Encounter (Signed)
No, I suggest ENT referral.  They may recommend CT of neck vs ultrasound

## 2012-03-15 NOTE — Telephone Encounter (Signed)
Pt was seen on 03-06-2012 and received abx. Pt still has lump in neck. please advise

## 2012-03-16 ENCOUNTER — Encounter: Payer: Self-pay | Admitting: Internal Medicine

## 2012-03-16 NOTE — Telephone Encounter (Signed)
Referral placed, pt aware

## 2012-03-16 NOTE — Addendum Note (Signed)
Addended by: Alfred Levins D on: 03/16/2012 02:42 PM   Modules accepted: Orders

## 2012-04-03 ENCOUNTER — Encounter: Payer: Self-pay | Admitting: Internal Medicine

## 2012-04-04 ENCOUNTER — Telehealth: Payer: Self-pay | Admitting: *Deleted

## 2012-04-04 MED ORDER — ZOLPIDEM TARTRATE 10 MG PO TABS: 10.0000 mg | ORAL_TABLET | Freq: Every evening | ORAL | Status: DC | PRN

## 2012-04-04 MED ORDER — ESZOPICLONE 2 MG PO TABS: 2.0000 mg | ORAL_TABLET | Freq: Every day | ORAL | Status: DC

## 2012-04-04 NOTE — Telephone Encounter (Signed)
Pt called back and wanted a to keep ambien rx.  Called pharmacy and cancelled lunesta and trazodone rx and gave refills for ambien (ok'd by Dr Artist Pais)

## 2012-04-04 NOTE — Addendum Note (Signed)
Addended by: Alfred Levins D on: 04/04/2012 04:18 PM   Modules accepted: Orders

## 2012-04-04 NOTE — Telephone Encounter (Signed)
I suggest she try taking lunesta 2 mg - one tab at bedtime as needed #30 with 1 RF.  I suggest she call back in 2-4 weeks with response whether medication is helping or not

## 2012-04-04 NOTE — Telephone Encounter (Signed)
Pt states that Trazodone is not working.  She had some ambien and has been using but she does not want to use that either.  Please advise

## 2012-04-04 NOTE — Telephone Encounter (Signed)
rx called in, pt aware 

## 2012-05-08 ENCOUNTER — Encounter: Payer: Self-pay | Admitting: Internal Medicine

## 2012-05-09 MED ORDER — ZOLPIDEM TARTRATE 10 MG PO TABS: 10.0000 mg | ORAL_TABLET | Freq: Every evening | ORAL | Status: DC | PRN

## 2012-05-09 NOTE — Telephone Encounter (Signed)
Fleet Contras, can you please call in Ambien 10 mg.  RF x 5.  Already updated on medication list.

## 2012-05-25 ENCOUNTER — Ambulatory Visit (INDEPENDENT_AMBULATORY_CARE_PROVIDER_SITE_OTHER): Payer: 59 | Admitting: Internal Medicine

## 2012-05-25 ENCOUNTER — Encounter: Payer: Self-pay | Admitting: Internal Medicine

## 2012-05-25 VITALS — BP 132/92 | HR 109 | Temp 100.2°F | Wt 173.0 lb

## 2012-05-25 DIAGNOSIS — R350 Frequency of micturition: Secondary | ICD-10-CM

## 2012-05-25 DIAGNOSIS — R509 Fever, unspecified: Secondary | ICD-10-CM

## 2012-05-25 DIAGNOSIS — J111 Influenza due to unidentified influenza virus with other respiratory manifestations: Secondary | ICD-10-CM | POA: Insufficient documentation

## 2012-05-25 MED ORDER — HYDROCODONE-HOMATROPINE 5-1.5 MG/5ML PO SYRP: 5.0000 mL | ORAL_SOLUTION | Freq: Three times a day (TID) | ORAL | Status: DC | PRN

## 2012-05-25 MED ORDER — AZITHROMYCIN 250 MG PO TABS: ORAL_TABLET | ORAL | Status: DC

## 2012-05-25 MED ORDER — OSELTAMIVIR PHOSPHATE 75 MG PO CAPS: 75.0000 mg | ORAL_CAPSULE | Freq: Two times a day (BID) | ORAL | Status: DC

## 2012-05-25 MED ORDER — MIRABEGRON ER 25 MG PO TB24: 25.0000 mg | ORAL_TABLET | Freq: Every day | ORAL | Status: DC

## 2012-05-25 NOTE — Progress Notes (Signed)
  Subjective:    Patient ID: Sandra [name], female    DOB: 1980/12/24, 32 y.o.   MRN: [id number]  HPI  32 year old white female complains of fever or cough and chest congestion for the last 24-48 hours. Patient reports fever of 101 at home. She also has mild achiness. Her 83-month-old daughter had flulike illness one week ago.  Patient previously referred to urologist for urinary frequency. Patient reports having cystoscopy completed. This was normal. Further bladder manometry testing was planned but patient declined secondary to financial reasons. She was seen by her gynecologist who suspected she may have overactive bladder. She is empirically on Detrol LA 4 mg once daily. However she complains of side effects of dry mouth constipation.  Review of Systems Negative for shortness of breath  Past Medical History  Diagnosis Date  . GERD (gastroesophageal reflux disease)   . SROM (spontaneous rupture of membranes) 01/31/2011  . Spontaneous vaginal delivery 02/01/2011    History   Social History  . Marital Status: Single    Spouse Name: N/A    Number of Children: N/A  . 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 Smoking status: Never Smoker   . Smokeless tobacco: Never Used  . Alcohol Use: No  . Drug Use: No  . Sexually Active: Yes    Birth Control/ Protection: None   Other Topics Concern  . Not on file   Social History Narrative  . No narrative on file    Past Surgical History  Procedure Date  . Tonsillectomy     Family History  Problem Relation Age of Onset  . Hypertension Father   . Diabetes type II Father     Mild diabetes/prediabetes  . Breast cancer Paternal Aunt   . Multiple myeloma Maternal Grandmother     Allergies  Allergen Reactions  . Fluoxetine     Headache   . Sulfa Antibiotics Rash    Current Outpatient Prescriptions on File Prior to Visit  Medication Sig Dispense Refill  .  famotidine (PEPCID) 10 MG tablet Take 10 mg by mouth daily.        Marland Kitchen zolpidem (AMBIEN) 10 MG tablet Take 1 tablet (10 mg total) by mouth at bedtime as needed.  30 tablet  5  . mirabegron ER (MYRBETRIQ) 25 MG TB24 Take 1 tablet (25 mg total) by mouth daily.  30 tablet  0    BP 132/92  Pulse 109  Temp 100.2 F (37.9 C) (Oral)  Wt 173 lb (78.472 kg)  SpO2 99%       Objective:   Physical Exam  Constitutional: She appears well-developed and well-nourished.  HENT:  Head: Normocephalic and atraumatic.  Right Ear: External ear normal.  Left Ear: External ear normal.       Oropharyngeal erythema  Neck: Neck supple.  Cardiovascular: Normal rate, regular rhythm and normal heart sounds.   Pulmonary/Chest: Effort normal.       Coarse breath sounds right mid and upper lung fields  Lymphadenopathy:    She has no cervical adenopathy.          Assessment & Plan:

## 2012-05-25 NOTE — Assessment & Plan Note (Addendum)
Patient tested positive for influenza. Symptoms started within 24-48 hours. Start Tamiflu 75 mg twice daily. Patient may also be developing secondary bronchitis. Patient to monitor symptoms over next 2-5 days. If cough does not improve patient understands to take azithromycin 500 mg once daily for 3 days. Use Hycodan for cough.   Patient advised to call office if symptoms persist or worsen.  Patient advised to contact her daughter's pediatrician regarding whether her 33 month daughter should be treated with Tamiflu prophylactically.

## 2012-05-25 NOTE — Assessment & Plan Note (Signed)
Patient seen by urologists for urinary frequency. Patient reports cystoscopy was normal. Additional blood testing was planned but patient opted out due to financial reasons. Her gynecologist empirically started Detrol LA 4 mg for overactive bladder. Symptoms somewhat improved but she complains of traumatic constipation.  Switch to Myrbetriq 25 mg once daily.

## 2012-05-25 NOTE — Patient Instructions (Addendum)
Please call our office if your symptoms do not improve or gets worse.  

## 2012-05-27 ENCOUNTER — Emergency Department (INDEPENDENT_AMBULATORY_CARE_PROVIDER_SITE_OTHER): Payer: 59

## 2012-05-27 ENCOUNTER — Emergency Department (HOSPITAL_COMMUNITY)
Admission: EM | Admit: 2012-05-27 | Discharge: 2012-05-27 | Disposition: A | Discharge status: Home / Self Care | Payer: 59 | Source: Home / Self Care | Attending: Emergency Medicine | Admitting: Emergency Medicine

## 2012-05-27 ENCOUNTER — Encounter (HOSPITAL_COMMUNITY): Payer: Self-pay | Admitting: Emergency Medicine

## 2012-05-27 DIAGNOSIS — J111 Influenza due to unidentified influenza virus with other respiratory manifestations: Secondary | ICD-10-CM

## 2012-05-27 MED ORDER — TRAMADOL HCL 50 MG PO TABS: 100.0000 mg | ORAL_TABLET | Freq: Three times a day (TID) | ORAL | Status: DC | PRN

## 2012-05-27 MED ORDER — ALBUTEROL SULFATE HFA 108 (90 BASE) MCG/ACT IN AERS: 1.0000 | INHALATION_SPRAY | Freq: Four times a day (QID) | RESPIRATORY_TRACT | Status: DC | PRN

## 2012-05-27 MED ORDER — HYDROCOD POLST-CHLORPHEN POLST 10-8 MG/5ML PO LQCR: 5.0000 mL | Freq: Two times a day (BID) | ORAL | Status: DC | PRN

## 2012-05-27 NOTE — ED Notes (Signed)
Pt states that she was dx with the flu on Thursday and is currently taking meds for the flu. Pt feels like medication is not working or that there is something else going on.  Pt c/o productive cough with green and red sputum. Bilateral ear pain. Chest pain with sob.   Pt is sitting up right with no signs of distress.

## 2012-05-27 NOTE — ED Provider Notes (Signed)
Chief Complaint  Patient presents with  . Influenza    dx with flu on thursday. pt feels like meds are not working. pt has a productive cough with green/red sputum. chest pain and sob. 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 She saw her primary care physician the day after the symptoms began and was diagnosed with influenza on the basis of a rapid test. She was begun on Tamiflu which he is taking right now. She was also given a cough medicine. Ever since then she's not felt much better. She continues to have cough productive of brown to green to red sputum, chest tightness and pain, sore throat, earache, fever as high as 102.4, and nausea. She denies any vomiting or diarrhea. She did get the flu vaccine.  Review of Systems:  Other than noted above, the patient denies any of the following symptoms. Systemic:  No fever, chills, sweats, fatigue, myalgias, headache, or anorexia. Eye:  No redness, pain or drainage. ENT:  No earache, ear congestion, nasal congestion, sneezing, rhinorrhea, sinus pressure, sinus pain, post nasal drip, or sore throat. Lungs:  No cough, sputum production, wheezing, shortness of breath, or chest pain. GI:  No abdominal pain, nausea, vomiting, or diarrhea.  PMFSH:  Past medical history, family history, social history, meds, and allergies were reviewed.  Physical Exam:   Vital signs:  BP 139/98  Pulse 102  Temp 100.1 F (37.8 C) (Oral)  Resp 22  SpO2 100% General:  Alert, in no distress. Eye:  No conjunctival injection or drainage. Lids were normal. ENT:  TMs and canals were normal, without erythema or inflammation.  Nasal mucosa was clear and uncongested, without drainage.  Mucous membranes were moist.  Pharynx was clear, without exudate or drainage.  There were no oral ulcerations or lesions. Neck:  Supple, no adenopathy,  tenderness or mass. Lungs:  No respiratory distress.  Lungs were clear to auscultation, without wheezes, rales or rhonchi.  Breath sounds were clear and equal bilaterally.  Heart:  Regular rhythm, without gallops, murmers or rubs. Skin:  Clear, warm, and dry, without rash or lesions.  Radiology:  Dg Chest 2 View  05/27/2012  *RADIOLOGY REPORT*  Clinical Data: Cough and fever for 4 days  CHEST - 2 VIEW  Comparison: 07/07/2009  Findings: The heart and mediastinal contours are within normal limits. The lungs are clear. No airspace disease, pleural effusion, or pneumothorax is identified. The visualized bony thorax is unremarkable.  IMPRESSION: No acute cardiopulmonary disease.   Original Report Authenticated By: Britta Mccreedy, M.D.    I reviewed the images independently and personally and concur with the radiologist's findings.  Assessment:  The encounter diagnosis was Influenza.  She may have some secondary acute bronchitis as well. Her primary care physician gave her a prescription for amoxicillin and I recommended she go ahead and start this. I also recommended followup if no better in 2-3 days.  Plan:   1.  The following meds were prescribed:   New Prescriptions   ALBUTEROL (PROVENTIL HFA;VENTOLIN HFA) 108 (90 BASE) MCG/ACT INHALER    Inhale 1-2 puffs into the lungs every 6 (six) hours as needed for wheezing.   CHLORPHENIRAMINE-HYDROCODONE (TUSSIONEX) 10-8 MG/5ML LQCR    Take 5 mLs by mouth every 12 (twelve) hours as needed.   TRAMADOL (ULTRAM) 50 MG TABLET    Take  2 tablets (100 mg total) by mouth every 8 (eight) hours as needed for pain.   2.  The patient was instructed in symptomatic care and handouts were given. 3.  The patient was told to return if becoming worse in any way, if no better in 2 or 3 days, and given some red flag symptoms that would indicate earlier return.   Reuben Likes, MD 05/27/12 [phone]

## 2012-05-30 ENCOUNTER — Encounter: Payer: Self-pay | Admitting: Internal Medicine

## 2012-06-02 ENCOUNTER — Telehealth: Payer: Self-pay | Admitting: Internal Medicine

## 2012-06-02 NOTE — Telephone Encounter (Signed)
Pt will wait till Monday and see how she feels

## 2012-06-02 NOTE — Telephone Encounter (Signed)
I suggest she return for visit on Monday.  If she does not want to wait, she can call tomorrow to be seen at Saturday clinic.

## 2012-06-02 NOTE — Telephone Encounter (Signed)
Pt was diagnosis pos w/ the flu and bronchitis. Pt has taken all antibiotics, but still feels bad, cough. Was concerned she may need another round of antibiotics.  Pt would like you to call today.

## 2012-06-24 ENCOUNTER — Other Ambulatory Visit: Payer: Self-pay

## 2012-06-27 ENCOUNTER — Ambulatory Visit (INDEPENDENT_AMBULATORY_CARE_PROVIDER_SITE_OTHER): Payer: Commercial Managed Care - PPO | Admitting: Surgery

## 2012-06-27 ENCOUNTER — Encounter (INDEPENDENT_AMBULATORY_CARE_PROVIDER_SITE_OTHER): Payer: Self-pay | Admitting: Surgery

## 2012-06-27 VITALS — BP 128/72 | HR 68 | Temp 97.3°F | Resp 16 | Ht 71.0 in | Wt 172.4 lb

## 2012-06-27 DIAGNOSIS — R599 Enlarged lymph nodes, unspecified: Secondary | ICD-10-CM

## 2012-06-27 NOTE — Progress Notes (Signed)
General Surgery [facility]  Chief Complaint  Patient presents with  . New Evaluation    eval persistant lymphadenopathy on lt side of neck - referral from Dr. 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 Patient first noted this and was treated with oral doxycycline by her primary care physician 6 months ago. The palpable abnormality did not resolve. In the interim she had an upper respiratory infection and took amoxicillin. Lymph nodes persisted. Patient feels as though there are 2 palpable small lymph nodes in the right supraclavicular fossa. She presents today for further evaluation on referral from her gynecologist.  Patient has no past medical history of head or neck surgery other than tonsillectomy. She has had no imaging studies. There have been no biopsies.  Patient denies any recent weight loss other than following the birth of her child. She denies night sweats. She denies fatigue. She denies other areas of lymphadenopathy.  Past Medical History  Diagnosis Date  . GERD (gastroesophageal reflux disease)   . SROM (spontaneous rupture of membranes) 01/31/2011  . Spontaneous vaginal delivery 02/01/2011  . Hypertension      Current Outpatient Prescriptions  Medication Sig Dispense Refill  . Calcium Carbonate-Vit D-Min (CALCIUM 1200 PO) Take by mouth.      . zolpidem (AMBIEN) 10 MG tablet Take 1 tablet (10 mg total) by mouth at bedtime as needed.  30 tablet  5  . famotidine (PEPCID) 10 MG tablet Take 10 mg by mouth daily.         No current facility-administered medications for this visit.     Allergies  Allergen Reactions  . Fluoxetine     Headache   . Sulfa Antibiotics Rash     Family History  Problem Relation Age of Onset  . Hypertension Father   . Diabetes type II Father     Mild  diabetes/prediabetes  . Breast cancer Paternal Aunt   . Cancer Paternal Aunt     cervical OR ovarian  . Multiple myeloma Maternal Grandmother   . Cancer Maternal Grandmother     multiple myeloma  . Cancer Paternal Grandfather     prostate     History   Social History  . Marital Status: Significant Other    Spouse Name: N/A    Number of Children: N/A  . 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 Smoking status: Never Smoker   . Smokeless tobacco: Never Used  . Alcohol Use: No  . Drug Use: No  . Sexually Active: Yes    Birth Control/ Protection: None   Other Topics Concern  . None   Social History Narrative  . None     REVIEW OF SYSTEMS - PERTINENT POSITIVES ONLY: Patient denies any recent weight loss other than following the birth of her child. She denies night sweats. She denies fatigue. She denies other areas of lymphadenopathy.  EXAM: Filed Vitals:   06/27/12 1339  BP: 128/72  Pulse: 68  Temp: 97.3 F (36.3 C)  Resp: 16    HEENT: normocephalic; pupils equal and reactive; sclerae clear; dentition good; mucous membranes moist NECK:  In the right supraclavicular fossa is a palpable firm 1 cm mass which is mobile and mildly tender; there is a second less than 1  cm mass near and immediately inferior to the first; symmetric on extension; no palpable anterior or posterior cervical lymphadenopathy; no supraclavicular masses; no tenderness CHEST: clear to auscultation bilaterally without rales, rhonchi, or wheezes CARDIAC: regular rate and rhythm without significant murmur; peripheral pulses are full EXT:  non-tender without edema; no deformity NEURO: no gross focal deficits; no sign of tremor   LABORATORY RESULTS: See [facility] (CHL-Epic) for most recent results   RADIOLOGY RESULTS: See [facility] (CHL-Epic) for most recent results   IMPRESSION: Palpable likely small 1 cm lymph node  right supraclavicular fossa, probably benign  PLAN: The patient and I discussed the above findings. She remains concerned about this palpable abnormality. I discussed her case with the radiologist and will order an ultrasound of the neck to better evaluate this region. We will have them examine her thyroid as well as her anterior and posterior cervical chains and the palpable abnormality in the right supraclavicular fossa. If there is any abnormality on ultrasound, we will consider CT scan of the neck with contrast or an ultrasound guided percutaneous biopsy if indicated.  I will contact the patient with the results of the ultrasound.  Velora Heckler, MD, FACS General & Endocrine Surgery [facility]   Visit Diagnoses: 1. Enlarged lymph node     Primary Care Physician: Thomos Lemons, DO

## 2012-06-27 NOTE — Patient Instructions (Signed)
Swollen Lymph Nodes  The lymphatic system filters fluid from around cells. It is like a system of blood vessels. These channels carry lymph instead of blood. The lymphatic system is an important part of the immune (disease fighting) system. When people talk about "swollen glands in the neck," they are usually talking about swollen lymph nodes. The lymph nodes are like the little traps for infection. You and your caregiver may be able to feel lymph nodes, especially swollen nodes, in these common areas: the groin (inguinal area), armpits (axilla), and above the clavicle (supraclavicular). You may also feel them in the neck (cervical) and the back of the head just above the hairline (occipital).  Swollen glands occur when there is any condition in which the body responds with an allergic type of reaction. For instance, the glands in the neck can become swollen from insect bites or any type of minor infection on the head. These are very noticeable in children with only minor problems. Lymph nodes may also become swollen when there is a tumor or problem with the lymphatic system, such as Hodgkin's disease.  TREATMENT   · Most swollen glands do not require treatment. They can be observed (watched) for a short period of time, if your caregiver feels it is necessary. Most of the time, observation is not necessary.  · Antibiotics (medicines that kill germs) may be prescribed by your caregiver. Your caregiver may prescribe these if he or she feels the swollen glands are due to a bacterial (germ) infection. Antibiotics are not used if the swollen glands are caused by a virus.  HOME CARE INSTRUCTIONS   · Take medications as directed by your caregiver. Only take over-the-counter or prescription medicines for pain, discomfort, or fever as directed by your caregiver.  SEEK MEDICAL CARE IF:   · If you begin to run a temperature greater than 102° F (38.9° C), or as your caregiver suggests.  MAKE SURE YOU:   · Understand these  instructions.  · Will watch your condition.  · Will get help right away if you are not doing well or get worse.  Document Released: 04/16/2002 Document Revised: 07/19/2011 Document Reviewed: 04/26/2005  [name]® Patient Information ©2013 [name].

## 2012-06-28 ENCOUNTER — Ambulatory Visit (HOSPITAL_COMMUNITY)
Admission: RE | Admit: 2012-06-28 | Discharge: 2012-06-28 | Disposition: A | Discharge status: Home / Self Care | Payer: 59 | Source: Ambulatory Visit | Attending: Surgery | Admitting: Surgery

## 2012-06-28 DIAGNOSIS — R599 Enlarged lymph nodes, unspecified: Secondary | ICD-10-CM | POA: Insufficient documentation

## 2012-06-29 ENCOUNTER — Telehealth (INDEPENDENT_AMBULATORY_CARE_PROVIDER_SITE_OTHER): Payer: Self-pay | Admitting: Surgery

## 2012-06-29 DIAGNOSIS — R599 Enlarged lymph nodes, unspecified: Secondary | ICD-10-CM

## 2012-06-29 NOTE — Telephone Encounter (Signed)
Called patient with results of thyroid ultrasound.  Thyroid normal.  Left supraclavicular lymph node measured 6 mm and appears normal.  Will see in office in 3 months for follow up.  She will call if there is any significant changes.  Velora Heckler, MD, [facility] Office: [phone]

## 2012-07-03 ENCOUNTER — Ambulatory Visit (INDEPENDENT_AMBULATORY_CARE_PROVIDER_SITE_OTHER): Payer: Self-pay | Admitting: Surgery

## 2012-07-19 ENCOUNTER — Encounter: Payer: Self-pay | Admitting: Internal Medicine

## 2012-09-06 ENCOUNTER — Ambulatory Visit (INDEPENDENT_AMBULATORY_CARE_PROVIDER_SITE_OTHER): Payer: 59 | Admitting: Family Medicine

## 2012-09-06 ENCOUNTER — Encounter: Payer: Self-pay | Admitting: Family Medicine

## 2012-09-06 VITALS — BP 150/110 | Temp 98.6°F | Wt 175.0 lb

## 2012-09-06 DIAGNOSIS — G4700 Insomnia, unspecified: Secondary | ICD-10-CM

## 2012-09-06 DIAGNOSIS — F4323 Adjustment disorder with mixed anxiety and depressed mood: Secondary | ICD-10-CM

## 2012-09-06 MED ORDER — CLONAZEPAM 0.5 MG PO TABS: 0.5000 mg | ORAL_TABLET | Freq: Two times a day (BID) | ORAL | Status: DC | PRN

## 2012-09-06 MED ORDER — SERTRALINE HCL 50 MG PO TABS: 50.0000 mg | ORAL_TABLET | Freq: Every day | ORAL | Status: DC

## 2012-09-06 NOTE — Progress Notes (Signed)
  Subjective:    Patient ID: Sandra [name], female    DOB: December 20, 1980, 32 y.o.   MRN: [id number]  HPI Patient is seen with progressive insomnia, anxiety, and depression symptoms She's had some chronic insomnia but progressive over the past several weeks She uses Ambien 10 mg at night and generally falls asleep but usually only gets about 4 hours sleep.  She works full-time and is raising her 57-month-old daughter as a single mother. Her boyfriend who is the biologic father of her daughter has recently expressed his interest in splitting ways. This has naturally been very difficult for her. She is having significant anxiety symptoms, especially at night. She has some depressed mood but no suicidal ideation. She is still working full time. She has supportive family. No history of any agitation  She feels her anxiety symptoms have progressed partly related to insomnia issues. She's had symptoms of prominent heartbeat but no dizziness or syncope. She's not having any persistent tachycardia. No recent weight changes. Appetite is stable.  Previously took fluoxetine but had headaches. No allergy.  Past Medical History  Diagnosis Date  . GERD (gastroesophageal reflux disease)   . SROM (spontaneous rupture of membranes) 01/31/2011  . Spontaneous vaginal delivery 02/01/2011  . Hypertension    Past Surgical History  Procedure Laterality Date  . Tonsillectomy      reports that she has never smoked. She has never used smokeless tobacco. She reports that she does not drink alcohol or use illicit drugs. family history includes Breast cancer in her paternal aunt; Cancer in her maternal grandmother, paternal aunt, and paternal grandfather; Diabetes type II in her father; Hypertension in her father; and Multiple myeloma in her maternal grandmother. Allergies  Allergen Reactions  . Fluoxetine     Headache   . Sulfa Antibiotics Rash      Review of Systems  Constitutional: Positive for fatigue.  Negative for appetite change and unexpected weight change.  Respiratory: Negative for shortness of breath.   Neurological: Negative for dizziness, syncope and headaches.  Psychiatric/Behavioral: Positive for sleep disturbance and dysphoric mood. Negative for suicidal ideas, confusion and agitation. The patient is nervous/anxious.        Objective:   Physical Exam  Constitutional: She is oriented to person, place, and time. She appears well-developed and well-nourished.  Neck: Neck supple. No thyromegaly present.  Cardiovascular: Normal rate and regular rhythm.  Exam reveals no gallop.   No murmur heard. Pulmonary/Chest: Effort normal and breath sounds normal. No respiratory distress. She has no wheezes. She has no rales.  Neurological: She is alert and oriented to person, place, and time.  Psychiatric:  Patient is tearful off and on during interview but very appropriate. Normal judgment and normal thought content          Assessment & Plan:  Adjustment disorder with mixed depression and anxiety symptoms. She has some chronic insomnia with recent exacerbation. We have suggested counseling -with names given. Start sertraline 50 mg once daily. Short-term only use of clonazepam 0.5 mg each bedtime. Sleep hygiene discussed.

## 2012-09-21 ENCOUNTER — Ambulatory Visit (INDEPENDENT_AMBULATORY_CARE_PROVIDER_SITE_OTHER): Payer: 59 | Admitting: Family Medicine

## 2012-09-21 ENCOUNTER — Encounter: Payer: Self-pay | Admitting: Family Medicine

## 2012-09-21 VITALS — BP 130/90 | Temp 98.2°F | Wt 172.0 lb

## 2012-09-21 DIAGNOSIS — F4323 Adjustment disorder with mixed anxiety and depressed mood: Secondary | ICD-10-CM

## 2012-09-21 NOTE — Progress Notes (Signed)
  Subjective:    Patient ID: Sandra [name], female    DOB: January 04, 1981, 32 y.o.   MRN: [id number]  HPI  Patient here for followup regarding recent depression and anxiety issues We started sertraline 50 mg daily and she is seeing great improvement in terms of her anxiety. She is taking Klonopin generally 1 mg at night which has helped her insomnia significantly. She had been on Ambien for over one year per gyn without much improvement in sleep. She feels that increased rest has helped with her coping She has not yet set up any counseling She had mild nausea for 2 days with sertraline, otherwise no side effects  Past Medical History  Diagnosis Date  . GERD (gastroesophageal reflux disease)   . SROM (spontaneous rupture of membranes) 01/31/2011  . Spontaneous vaginal delivery 02/01/2011  . Hypertension    Past Surgical History  Procedure Laterality Date  . Tonsillectomy      reports that she has never smoked. She has never used smokeless tobacco. She reports that she does not drink alcohol or use illicit drugs. family history includes Breast cancer in her paternal aunt; Cancer in her maternal grandmother, paternal aunt, and paternal grandfather; Diabetes type II in her father; Hypertension in her father; and Multiple myeloma in her maternal grandmother. Allergies  Allergen Reactions  . Fluoxetine     Headache   . Sulfa Antibiotics Rash      Review of Systems  Psychiatric/Behavioral: Negative for suicidal ideas and agitation.       Objective:   Physical Exam  Constitutional: She appears well-developed and well-nourished.  Cardiovascular: Normal rate and regular rhythm.   Pulmonary/Chest: Effort normal and breath sounds normal. No respiratory distress. She has no wheezes. She has no rales.  Psychiatric: She has a normal mood and affect. Her behavior is normal. Judgment and thought content normal.          Assessment & Plan:  Adjustment disorder with mixed anxiety and  depression. Improved. Continue sertraline 50 mg daily for at least 6-9 months. Continue with as needed clonazepam at night and try tapering back if possible.  Follow up with primary in 2 months.

## 2012-09-25 ENCOUNTER — Other Ambulatory Visit: Payer: Self-pay | Admitting: Family Medicine

## 2012-09-25 NOTE — Telephone Encounter (Signed)
Clonazepam last filled 09-06-12, #30 with 0 refills

## 2012-09-27 ENCOUNTER — Encounter: Payer: Self-pay | Admitting: Family Medicine

## 2012-09-27 NOTE — Telephone Encounter (Signed)
Pt is aware may take up to 3 business days for refills. Pt needs rx today

## 2012-09-27 NOTE — Telephone Encounter (Signed)
PT called to inquire about her recent refill request for her clonazePAM (KLONOPIN) 0.5 MG tablet  . She stated that Dr. Caryl Never prescribed it at her last visit, and said that he didn't mind refilling it. Please assist.

## 2012-09-28 NOTE — Telephone Encounter (Signed)
Refill OK

## 2012-10-11 ENCOUNTER — Telehealth: Payer: Self-pay | Admitting: Family Medicine

## 2012-10-11 ENCOUNTER — Encounter: Payer: Self-pay | Admitting: Family Medicine

## 2012-10-11 ENCOUNTER — Ambulatory Visit (INDEPENDENT_AMBULATORY_CARE_PROVIDER_SITE_OTHER): Payer: 59 | Admitting: Family Medicine

## 2012-10-11 VITALS — BP 108/80 | Temp 98.2°F | Wt 169.0 lb

## 2012-10-11 DIAGNOSIS — R21 Rash and other nonspecific skin eruption: Secondary | ICD-10-CM

## 2012-10-11 LAB — CBC WITH DIFFERENTIAL/PLATELET
Basophils Absolute: 0 10*3/uL (ref 0.0–0.1)
Eosinophils Absolute: 0.1 10*3/uL (ref 0.0–0.7)
Lymphocytes Relative: 32.6 % (ref 12.0–46.0)
Lymphs Abs: 1.9 10*3/uL (ref 0.7–4.0)
MCHC: 33.3 g/dL (ref 30.0–36.0)
Monocytes Relative: 8.5 % (ref 3.0–12.0)
Neutro Abs: 3.3 10*3/uL (ref 1.4–7.7)
Platelets: 219 10*3/uL (ref 150.0–400.0)
RDW: 12.9 % (ref 11.5–14.6)

## 2012-10-11 NOTE — Progress Notes (Signed)
Chief Complaint  Patient presents with  . red raised bumps on hand    painful; comes and goes     HPI:  Acute visit for bumps on hand: -started maybe a week ago -reports develops small red bumpy rash on hands in the morning, that then fades away -not really itchy, a little painful -she seems very anxious and is worried about "platelets" because read that sertraline which she recently started can cause thrombocytopenia -denies: fevers, malaise, chills, sinus issues, new exposures to soaps, lotions, or any other new exposures and child is not sick  ROS: See pertinent positives and negatives per HPI.  Past Medical History  Diagnosis Date  . GERD (gastroesophageal reflux disease)   . SROM (spontaneous rupture of membranes) 01/31/2011  . Spontaneous vaginal delivery 02/01/2011  . Hypertension     Family History  Problem Relation Age of Onset  . Hypertension Father   . Diabetes type II Father     Mild diabetes/prediabetes  . Breast cancer Paternal Aunt   . Cancer Paternal Aunt     cervical OR ovarian  . Multiple myeloma Maternal Grandmother   . Cancer Maternal Grandmother     multiple myeloma  . Cancer Paternal Grandfather     prostate    History   Social History  . Marital Status: Significant Other    Spouse Name: N/A    Number of Children: N/A  . Years of Education: N/A   Occupational History  . 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 Smoking status: Never Smoker   . Smokeless tobacco: Never Used  . Alcohol Use: No  . Drug Use: No  . Sexually Active: Yes    Birth Control/ Protection: None   Other Topics Concern  . None   Social History Narrative  . None    Current outpatient prescriptions:Calcium Carbonate-Vit D-Min (CALCIUM 1200 PO), Take by mouth., Disp: , Rfl: ;  clonazePAM (KLONOPIN) 0.5 MG tablet, TAKE 1 TABLET BY MOUTH TWICE DAILY AS NEEDED FOR ANXIETY, Disp: 30 tablet, Rfl: 0;  famotidine (PEPCID) 10 MG tablet, Take 10 mg by mouth  daily.  , Disp: , Rfl: ;  sertraline (ZOLOFT) 50 MG tablet, Take 1 tablet (50 mg total) by mouth daily., Disp: 30 tablet, Rfl: 3 zolpidem (AMBIEN) 10 MG tablet, Take 1 tablet (10 mg total) by mouth at bedtime as needed., Disp: 30 tablet, Rfl: 5  EXAM:  Filed Vitals:   10/11/12 1114  BP: 108/80  Temp: 98.2 F (36.8 C)    Body mass index is 23.58 kg/(m^2).  GENERAL: vitals reviewed and listed above, alert, oriented, appears well hydrated and in no acute distress  HEENT: atraumatic, conjunttiva clear, no obvious abnormalities on inspection of external nose and ears  NECK: no obvious masses on inspection  SKIN: few small scattered erythematous fine papules on palmar fingers, hands wrist  MS: moves all extremities without noticeable abnormality  PSYCH: pleasant and cooperative, no obvious depression, anxious  ASSESSMENT AND PLAN:  Discussed the following assessment and plan:  Rash - Plan: CBC with Differential  -on cell phone picture of hands when rash flared up shows scattered fine papular rash on both hands -looks like contact dermatitis on cell pictures, very little findings currently on exam - she reports the lesions come and go each morning - doubt systemic illness, coxsackie or scabies given comes and goes. Suspect contact dermatitis or hand eczema and offered tx, also discussed trial off sertraline as she thinks may  be related - she did not want to do this and prefers to "check platelets" and observe. Will follow up with PCP in 1-2 weeks. -Patient advised to return or notify a doctor immediately if symptoms worsen or persist or new concerns arise.  There are no Patient Instructions on file for this visit.   Kriste Basque R.

## 2012-10-11 NOTE — Telephone Encounter (Signed)
Patient Information:  Caller Name: Belia  Phone: [phone]  Patient: Sandra [name]  Gender: Female  DOB: 10/20/1980  Age: 32 Years  PCP: Evelena Peat (Family Practice)  Pregnant: No  Office Follow Up:  Does the office need to follow up with this patient?: Yes  Instructions For The Office: Can she try Lexapro?  RN Note:  Already seen in office for rash. She is wondering if it is allergic rash caused by Sertraline.  Symptoms  Reason For Call & Symptoms: Started taking Sertraline 5 weeks ago and now she has small red bumps on hands (on & off for past 2-3 weeks) and she has been very nauseous for the past week. She is concerned that she is not sleeping well. She is wanting to try Ambien LA and wean off the Sertraline to see if rash and nausea go away. Also needing refill on med for Reflux. Is requesting [facility] for Ambien LA and Pepcid and med for depression be sent to [facility].   Reviewed Health History In EMR: Yes  Reviewed Medications In EMR: Yes  Reviewed Allergies In EMR: Yes  Reviewed Surgeries / Procedures: Yes  Date of Onset of Symptoms: 10/11/2012  Treatments Tried: Med for Reflux  Treatments Tried Worked: No OB / GYN:  LMP: Unknown  Guideline(s) Used:  Rash or Redness - Localized  Disposition Per Guideline:   See Within 3 Days in Office  Reason For Disposition Reached:   Localized rash present > 7 days  Advice Given:  Avoid the Cause:   Try to find the cause. Consider irritants like a plant (e.g., poison ivy or evergreens), chemicals (e.g., solvents or insecticides), fiberglass, a new cosmetic, or new jewelry (called contact dermatitis). A pet may be carrying the irritating substance (e.g., with poison ivy or poison oak).  Expected Course:  Most of these rashes pass in 2 to 3 days.  Contagiousness:  Adults with localized rashes do not need to miss any work or school.  Call Back If:  Rash spreads or becomes worse  Rash lasts longer  than 1 week  You become worse.  Patient Refused Recommendation:  Patient Requests Prescription  Wanting to wean off Sertraline and try another med for Depression/Anxiety.

## 2012-10-11 NOTE — Telephone Encounter (Signed)
Spoke with patient.  We will stop Ambien 10 mg.  Start Ambien CR 12.5 mg one po qhs prn #30 with 3 refill [facility]. Feel nausea not likely secondary to Sertraline as she has been on this already for several weeks.  Follow up if rash persists.

## 2012-10-12 ENCOUNTER — Other Ambulatory Visit: Payer: Self-pay | Admitting: Family Medicine

## 2012-10-12 MED ORDER — ZOLPIDEM TARTRATE ER 12.5 MG PO TBCR: 12.5000 mg | EXTENDED_RELEASE_TABLET | Freq: Every evening | ORAL | Status: DC | PRN

## 2012-10-12 NOTE — Telephone Encounter (Signed)
Called to the pharmacy.

## 2012-10-12 NOTE — Progress Notes (Signed)
Quick Note:  Called and spoke with pt and pt is aware. ______ 

## 2012-10-12 NOTE — Progress Notes (Signed)
Quick Note:  Left a message for pt to return call. ______ 

## 2012-10-13 ENCOUNTER — Encounter: Payer: Self-pay | Admitting: Family Medicine

## 2012-11-02 ENCOUNTER — Telehealth: Payer: Self-pay | Admitting: Family Medicine

## 2012-11-02 MED ORDER — CLONAZEPAM 0.5 MG PO TABS: ORAL_TABLET | ORAL | Status: DC

## 2012-11-02 NOTE — Telephone Encounter (Signed)
Rx request phoned to pharmacy/[facility]  

## 2012-11-02 NOTE — Telephone Encounter (Signed)
Pt needs refill on clonazepam 0.5mg  #30 with refills call into [facility]. Pt is out

## 2012-11-02 NOTE — Telephone Encounter (Signed)
Refill OK

## 2012-11-03 ENCOUNTER — Encounter: Payer: Self-pay | Admitting: Family Medicine

## 2012-11-03 NOTE — Telephone Encounter (Signed)
Called pharmacy pt picked up Rx.

## 2012-12-05 ENCOUNTER — Other Ambulatory Visit: Payer: Self-pay | Admitting: Family Medicine

## 2012-12-07 ENCOUNTER — Other Ambulatory Visit: Payer: Self-pay | Admitting: Family Medicine

## 2012-12-07 NOTE — Telephone Encounter (Signed)
Last visit on 09/21/12 11/02/12 last refill #30 no refills

## 2012-12-07 NOTE — Telephone Encounter (Signed)
PT calling to inquire about following refill request. She states that she is completely out and would like to pick up RX this afternoon. Please follow up with pt on outcome. Thank you!

## 2012-12-23 ENCOUNTER — Emergency Department (INDEPENDENT_AMBULATORY_CARE_PROVIDER_SITE_OTHER): Payer: 59

## 2012-12-23 ENCOUNTER — Emergency Department (HOSPITAL_COMMUNITY)
Admission: EM | Admit: 2012-12-23 | Discharge: 2012-12-23 | Disposition: A | Discharge status: Home / Self Care | Payer: 59 | Source: Home / Self Care | Attending: Emergency Medicine | Admitting: Emergency Medicine

## 2012-12-23 ENCOUNTER — Encounter (HOSPITAL_COMMUNITY): Payer: Self-pay | Admitting: *Deleted

## 2012-12-23 DIAGNOSIS — J019 Acute sinusitis, unspecified: Secondary | ICD-10-CM

## 2012-12-23 DIAGNOSIS — B341 Enterovirus infection, unspecified: Secondary | ICD-10-CM

## 2012-12-23 LAB — POCT RAPID STREP A: Streptococcus, Group A Screen (Direct): NEGATIVE

## 2012-12-23 MED ORDER — AMOXICILLIN-POT CLAVULANATE 875-125 MG PO TABS: 1.0000 | ORAL_TABLET | Freq: Two times a day (BID) | ORAL | Status: DC

## 2012-12-23 MED ORDER — OXYCODONE-ACETAMINOPHEN 5-325 MG PO TABS: ORAL_TABLET | ORAL | Status: DC

## 2012-12-23 MED ORDER — HYDROCOD POLST-CHLORPHEN POLST 10-8 MG/5ML PO LQCR: 5.0000 mL | Freq: Two times a day (BID) | ORAL | Status: DC | PRN

## 2012-12-23 NOTE — ED Provider Notes (Signed)
Chief Complaint:   Chief Complaint  Patient presents with  . 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 She presents with a two-day history of sore throat, swollen glands, rhinorrhea, temperature of up to 101, headache, myalgias, dry cough, chest tightness, chest pain, and nausea. She states coxsackie viral infection is been going around at her daughter's daycare. She's had no exposure to strep. She tried ibuprofen and Tylenol without relief.  Review of Systems:  Other than noted above, the patient denies any of the following symptoms: Systemic:  No fevers, chills, sweats, weight loss or gain, fatigue, or tiredness. Eye:  No redness or discharge. ENT:  No ear pain, drainage, headache, nasal congestion, drainage, sinus pressure, difficulty swallowing, or sore throat. Neck:  No neck pain or swollen glands. Lungs:  No cough, sputum production, hemoptysis, wheezing, chest tightness, shortness of breath or chest pain. GI:  No abdominal pain, nausea, vomiting or diarrhea.  PMFSH:  Past medical history, family history, social history, meds, and allergies were reviewed. She's allergic to sulfa. She takes Klonopin, Ambien, and sertraline.  Physical Exam:   Vital signs:  BP 129/78  Pulse 79  Temp(Src) 98.8 F (37.1 C) (Oral)  Resp 16  SpO2 100% General:  Alert and oriented.  In no distress.  Skin warm and dry. Eye:  No conjunctival injection or drainage. Lids were normal. ENT:  TMs and canals were normal, without erythema or inflammation.  Nasal mucosa was clear and uncongested, without drainage.  Mucous membranes were moist.  Pharynx showed a large amount of thick, yellow postnasal drainage, diffuse erythema, and 2 small ulcerations of the left posterior pharynx.  There were no oral ulcerations or lesions. Neck:  Supple, no adenopathy, tenderness or mass. Lungs:  No respiratory distress.  Lungs were clear to auscultation,  without wheezes, rales or rhonchi.  Breath sounds were clear and equal bilaterally.  Heart:  Regular rhythm, without gallops, murmers or rubs. Skin:  Clear, warm, and dry, without rash or lesions.  Labs:   Results for orders placed during the hospital encounter of 12/23/12  POCT RAPID STREP A ([facility] ONLY)      Result Value Range   Streptococcus, Group A Screen (Direct) NEGATIVE  NEGATIVE     Radiology:  Dg Chest 2 View  12/23/2012   *RADIOLOGY REPORT*  Clinical Data: 32 year old female with nonproductive cough.  CHEST - 2 VIEW  Comparison: 05/27/2012.  Findings: Stable and normal lung volumes. Normal cardiac size and mediastinal contours.  Visualized tracheal air column is within normal limits.  The lungs remain clear.  No pneumothorax or effusion.  Very mild scoliosis. No acute osseous abnormality identified.  IMPRESSION: Negative, no acute cardiopulmonary abnormality.   Original Report Authenticated By: Erskine Speed, M.D.   Assessment:  The primary encounter diagnosis was Infection, coxsackie virus. A diagnosis of Acute sinusitis was also pertinent to this visit.  This is most likely coxsackie viral infection. Probably herpangina with secondary sinusitis. Her symptoms are severe, so I'll go ahead with Augmentin as well as Tussionex and Percocet for symptomatic relief.  Plan:   1.  The following meds were prescribed:   Discharge Medication List as of 12/23/2012  5:53 PM    START taking these medications   Details  amoxicillin-clavulanate (AUGMENTIN) 875-125 MG per tablet Take 1 tablet by mouth 2 (two) times daily., Starting 12/23/2012, Until Discontinued, Normal    chlorpheniramine-HYDROcodone (  TUSSIONEX) 10-8 MG/5ML LQCR Take 5 mL by mouth every 12 (twelve) hours as needed., Starting 12/23/2012, Until Discontinued, Normal    oxyCODONE-acetaminophen (PERCOCET) 5-325 MG per tablet 1 to 2 tablets every 6 hours as needed for pain., Print       2.  The patient was instructed in  symptomatic care and handouts were given. 3.  The patient was told to return if becoming worse in any way, if no better in 3 or 4 days, and given some red flag symptoms such as worsening fever or difficulty breathing that would indicate earlier return. 4.  Follow up here if necessary.      Reuben Likes, MD 12/23/12 [phone]

## 2012-12-23 NOTE — ED Notes (Signed)
Complains of sore throat and headache x 1 day; body aches; fever/chills with cough; denies diarrhea, chest congestion head congestion.

## 2012-12-24 ENCOUNTER — Encounter (HOSPITAL_COMMUNITY): Payer: Self-pay | Admitting: *Deleted

## 2012-12-24 ENCOUNTER — Emergency Department (HOSPITAL_COMMUNITY)
Admission: EM | Admit: 2012-12-24 | Discharge: 2012-12-24 | Disposition: A | Discharge status: Home / Self Care | Payer: 59 | Attending: Emergency Medicine | Admitting: Emergency Medicine

## 2012-12-24 DIAGNOSIS — J029 Acute pharyngitis, unspecified: Secondary | ICD-10-CM | POA: Insufficient documentation

## 2012-12-24 DIAGNOSIS — B9789 Other viral agents as the cause of diseases classified elsewhere: Secondary | ICD-10-CM | POA: Insufficient documentation

## 2012-12-24 DIAGNOSIS — K219 Gastro-esophageal reflux disease without esophagitis: Secondary | ICD-10-CM | POA: Insufficient documentation

## 2012-12-24 DIAGNOSIS — R220 Localized swelling, mass and lump, head: Secondary | ICD-10-CM | POA: Insufficient documentation

## 2012-12-24 DIAGNOSIS — R221 Localized swelling, mass and lump, neck: Secondary | ICD-10-CM | POA: Insufficient documentation

## 2012-12-24 DIAGNOSIS — I10 Essential (primary) hypertension: Secondary | ICD-10-CM | POA: Insufficient documentation

## 2012-12-24 DIAGNOSIS — B349 Viral infection, unspecified: Secondary | ICD-10-CM

## 2012-12-24 MED ORDER — ONDANSETRON 4 MG PO TBDP
4.0000 mg | ORAL_TABLET | Freq: Once | ORAL | Status: AC
  Administered 2012-12-24: 4 mg via ORAL
  Filled 2012-12-24: qty 1

## 2012-12-24 MED ORDER — ACETAMINOPHEN 325 MG PO TABS
650.0000 mg | ORAL_TABLET | Freq: Once | ORAL | Status: DC
  Filled 2012-12-24: qty 2

## 2012-12-24 MED ORDER — HYDROMORPHONE HCL PF 1 MG/ML IJ SOLN
1.0000 mg | Freq: Once | INTRAMUSCULAR | Status: AC
  Administered 2012-12-24: 1 mg via INTRAVENOUS
  Filled 2012-12-24: qty 1

## 2012-12-24 MED ORDER — ACETAMINOPHEN 160 MG/5ML PO SOLN
ORAL | Status: AC
  Administered 2012-12-24: 650 mg
  Filled 2012-12-24: qty 20.3

## 2012-12-24 MED ORDER — ONDANSETRON HCL 4 MG/2ML IJ SOLN
INTRAMUSCULAR | Status: AC
  Filled 2012-12-24: qty 2

## 2012-12-24 MED ORDER — ONDANSETRON HCL 4 MG/2ML IJ SOLN
4.0000 mg | Freq: Once | INTRAMUSCULAR | Status: AC
  Administered 2012-12-24: 4 mg via INTRAVENOUS
  Filled 2012-12-24: qty 2

## 2012-12-24 MED ORDER — KETOROLAC TROMETHAMINE 30 MG/ML IJ SOLN
30.0000 mg | Freq: Once | INTRAMUSCULAR | Status: AC
  Administered 2012-12-24: 30 mg via INTRAVENOUS

## 2012-12-24 MED ORDER — ONDANSETRON 4 MG PO TBDP: 4.0000 mg | ORAL_TABLET | Freq: Three times a day (TID) | ORAL | Status: DC | PRN

## 2012-12-24 MED ORDER — SODIUM CHLORIDE 0.9 % IV SOLN
Freq: Once | INTRAVENOUS | Status: AC
  Administered 2012-12-24: 09:00:00 via INTRAVENOUS

## 2012-12-24 NOTE — ED Notes (Signed)
Pt ambulatory to the bathroom without difficulty.

## 2012-12-24 NOTE — ED Provider Notes (Signed)
Medical screening examination/treatment/procedure(s) were performed by non-physician practitioner and as supervising physician I was immediately available for consultation/collaboration.   Holt Woolbright, MD 12/24/12 1222 

## 2012-12-24 NOTE — ED Notes (Signed)
Patient assisted to bathroom ambulatory with steady gait. States she feels " a little bit better". Assisted to position of comfort with call bell in reach.

## 2012-12-24 NOTE — ED Provider Notes (Signed)
CSN: [id number]     Arrival date & time 12/24/12  0745 History     First MD Initiated Contact with Patient 12/24/12 [phone]     Chief Complaint  Patient presents with  . Emesis  . Oral Swelling   (Consider location/radiation/quality/duration/timing/severity/associated sxs/prior Treatment) Patient is a 32 y.o. female presenting with vomiting. The history is provided by the patient. No language interpreter was used.  Emesis Severity:  Moderate Duration:  2 days Timing:  Constant Number of daily episodes:  2 Progression:  Worsening Chronicity:  New Relieved by:  Nothing Associated symptoms: sore throat     Past Medical History  Diagnosis Date  . GERD (gastroesophageal reflux disease)   . SROM (spontaneous rupture of membranes) 01/31/2011  . Spontaneous vaginal delivery 02/01/2011  . Hypertension    Past Surgical History  Procedure Laterality Date  . Tonsillectomy     Family History  Problem Relation Age of Onset  . Hypertension Father   . Diabetes type II Father     Mild diabetes/prediabetes  . Breast cancer Paternal Aunt   . Cancer Paternal Aunt     cervical OR ovarian  . Multiple myeloma Maternal Grandmother   . Cancer Maternal Grandmother     multiple myeloma  . Cancer Paternal Grandfather     prostate   History  Substance Use Topics  . Smoking status: Never Smoker   . Smokeless tobacco: Never Used  . Alcohol Use: No   OB History   Grav Para Term Preterm Abortions TAB SAB Ect Mult Living   1 1 1       1      Review of Systems  HENT: Positive for sore throat and mouth sores.   Gastrointestinal: Positive for vomiting.  All other systems reviewed and are negative.    Allergies  Fluoxetine and Sulfa antibiotics  Home Medications   Current Outpatient Rx  Name  Route  Sig  Dispense  Refill  . amoxicillin-clavulanate (AUGMENTIN) 875-125 MG per tablet   Oral   Take 1 tablet by mouth 2 (two) times daily.   20 tablet   0   . Calcium Carbonate-Vit D-Min  (CALCIUM 1200 PO)   Oral   Take 1 tablet by mouth daily as needed (Dietary supplement).          . chlorpheniramine-HYDROcodone (TUSSIONEX) 10-8 MG/5ML LQCR   Oral   Take 5 mL by mouth every 12 (twelve) hours as needed.   140 mL   0   . clonazePAM (KLONOPIN) 0.5 MG tablet   Oral   Take 0.5 mg by mouth 2 (two) times daily as needed for anxiety.         . famotidine (PEPCID) 10 MG tablet   Oral   Take 10 mg by mouth daily as needed for heartburn.          Marland Kitchen oxyCODONE-acetaminophen (PERCOCET) 5-325 MG per tablet      1 to 2 tablets every 6 hours as needed for pain.   20 tablet   0   . zolpidem (AMBIEN CR) 12.5 MG CR tablet   Oral   Take 1 tablet (12.5 mg total) by mouth at bedtime as needed for sleep.   30 tablet   3    BP 118/70  Pulse 98  Temp(Src) 99.7 F (37.6 C) (Oral)  Resp 20  SpO2 97% Physical Exam  Nursing note and vitals reviewed. Constitutional: She is oriented to person, place, and time. She appears well-developed and well-nourished.  HENT:  Head: Normocephalic.  Right Ear: External ear normal.  Left Ear: External ear normal.  Mouth/Throat: Oropharyngeal exudate present.  Ulcers mouth, erythema  Eyes: Pupils are equal, round, and reactive to light.  Neck: Normal range of motion. Neck supple.  Cardiovascular: Normal rate and normal heart sounds.   Pulmonary/Chest: Effort normal and breath sounds normal.  Abdominal: Soft.  Musculoskeletal: Normal range of motion.  Neurological: She is alert and oriented to person, place, and time. She has normal reflexes.  Skin: Skin is warm.  Psychiatric: She has a normal mood and affect.    ED Course   Procedures (including critical care time)  Labs Reviewed - No data to display Dg Chest 2 View  12/23/2012   *RADIOLOGY REPORT*  Clinical Data: 32 year old female with nonproductive cough.  CHEST - 2 VIEW  Comparison: 05/27/2012.  Findings: Stable and normal lung volumes. Normal cardiac size and mediastinal  contours.  Visualized tracheal air column is within normal limits.  The lungs remain clear.  No pneumothorax or effusion.  Very mild scoliosis. No acute osseous abnormality identified.  IMPRESSION: Negative, no acute cardiopulmonary abnormality.   Original Report Authenticated By: Erskine Speed, M.D.   No diagnosis found.  MDM  Pt is on percocet and Augmentin.  Pt  Was started on yesterday at Urgent care.  Pt given Iv fluids x 2 liters and pain medication,   Temp decreased with tylenol.    Pt advised to continue medications for sinusitis.     Lonia Skinner Middleton, PA-C 12/24/12 1221

## 2012-12-24 NOTE — ED Notes (Signed)
Patient states she feels improved. DC completed.

## 2012-12-24 NOTE — ED Notes (Signed)
Pt reports not feeling well x 2 days, having vomiting and sore throat, went to ucc yesterday and started on augmentin. Reports now having tongue and throat swelling since starting the augmentin, still having n/v.

## 2012-12-25 ENCOUNTER — Encounter: Payer: Self-pay | Admitting: Family Medicine

## 2012-12-25 ENCOUNTER — Telehealth: Payer: Self-pay | Admitting: Family Medicine

## 2012-12-25 ENCOUNTER — Ambulatory Visit (INDEPENDENT_AMBULATORY_CARE_PROVIDER_SITE_OTHER): Payer: 59 | Admitting: Family Medicine

## 2012-12-25 VITALS — BP 128/78 | HR 74 | Temp 98.4°F | Wt 178.0 lb

## 2012-12-25 DIAGNOSIS — J029 Acute pharyngitis, unspecified: Secondary | ICD-10-CM

## 2012-12-25 LAB — CULTURE, GROUP A STREP

## 2012-12-25 MED ORDER — OXYCODONE-ACETAMINOPHEN 5-325 MG PO TABS: ORAL_TABLET | ORAL | Status: DC

## 2012-12-25 NOTE — Telephone Encounter (Signed)
Get her in today to see me

## 2012-12-25 NOTE — Telephone Encounter (Signed)
PT states that she was seen in the ER yesterday, and would like to be seen today. She would like to obtain additional pain medication for the sores/ulcers in the back of her throat. She states that she has had to utilize the medication frequently, in order to be able to swallow anything. Should she be seen today, or at another time this week? Please assist.

## 2012-12-25 NOTE — Progress Notes (Signed)
  Subjective:    Patient ID: Sandra [name], female    DOB: [birth year], 32 y.o.   MRN: [id number]  HPI Patient seen with persistent sore throat. This past Friday she developed relatively acute sore throat along with fever. She was seen Saturday urgent care and diagnosed with possible coxsackie infection and sinusitis She was started on Augmentin and and given Percocet for her severe sore throat symptoms. Saturday night she developed severe nausea and vomiting.  Sunday she presented to emergency department with dehydration. She was given Zofran and 2 L of fluid and felt better afterwards. Still has severe sore throat. She has continued Augmentin. She has continued Percocet for severe pain. She has tried warm liquids and cold liquids but is not tolerating either. She is keeping some fluids down today but both are uncomfortable.  She has never had any hand or foot lesions but has had a couple of coworkers recently diagnosed with coxsackie infection. She has a 4-year-old daughter who has not been ill  Past Medical History  Diagnosis Date  . GERD (gastroesophageal reflux disease)   . SROM (spontaneous rupture of membranes) 01/31/2011  . Spontaneous vaginal delivery 02/01/2011  . Hypertension    Past Surgical History  Procedure Laterality Date  . Tonsillectomy      reports that she has never smoked. She has never used smokeless tobacco. She reports that she does not drink alcohol or use illicit drugs. family history includes Breast cancer in her paternal aunt; Cancer in her maternal grandmother, paternal aunt, and paternal grandfather; Diabetes type II in her father; Hypertension in her father; Multiple myeloma in her maternal grandmother. Allergies  Allergen Reactions  . Fluoxetine     Headache   . Sulfa Antibiotics Rash      Review of Systems  Constitutional: Positive for fatigue. Negative for chills.  HENT: Positive for sore throat.   Respiratory: Negative for cough.    Gastrointestinal: Negative for nausea and vomiting.  Skin: Negative for rash.  Neurological: Negative for headaches.       Objective:   Physical Exam  Constitutional: She appears well-developed and well-nourished.  HENT:  Right Ear: External ear normal.  Left Ear: External ear normal.  She has a couple discrete lesions posterior pharynx region. No exudate  Neck: Neck supple.  Minimally tender anterior cervical adenopathy bilaterally  Cardiovascular: Normal rate and regular rhythm.   Pulmonary/Chest: Effort normal and breath sounds normal. No respiratory distress. She has no wheezes. She has no rales.  Lymphadenopathy:    She has cervical adenopathy.  Skin: No rash noted.          Assessment & Plan:  Acute pharyngitis. Suspect viral-?coxsackie. Finish out Augmentin for possible sinusitis. Limited Percocet 5 mg #10 tablets one every 6 hours for severe pain. We discussed other options such as viscous Xylocaine but at this point she'll try OTC topical spray for symptom relief as well as Motrin

## 2013-01-01 ENCOUNTER — Other Ambulatory Visit: Payer: Self-pay | Admitting: Family Medicine

## 2013-01-30 ENCOUNTER — Encounter: Payer: Self-pay | Admitting: Family Medicine

## 2013-01-31 ENCOUNTER — Telehealth: Payer: Self-pay

## 2013-01-31 MED ORDER — ZOLPIDEM TARTRATE ER 12.5 MG PO TBCR: 12.5000 mg | EXTENDED_RELEASE_TABLET | Freq: Every evening | ORAL | Status: DC | PRN

## 2013-01-31 NOTE — Telephone Encounter (Signed)
Patient wants to know if she can get Ambien(IR) 10mg  also. Just called in the Ambien 12.5mg . Last refill on the Ambien 10 mg 05/09/12 #30 5 refills  [place]

## 2013-01-31 NOTE — Telephone Encounter (Signed)
Refill for 3 months. 

## 2013-01-31 NOTE — Telephone Encounter (Signed)
I would not recommend both, especially if she is taking any Klonopin.

## 2013-01-31 NOTE — Telephone Encounter (Signed)
Ambien   Last refill 10/12/12 #30 3 refills Last visit 12/25/12

## 2013-01-31 NOTE — Telephone Encounter (Signed)
Called in rx

## 2013-02-01 MED ORDER — ZOLPIDEM TARTRATE 10 MG PO TABS: 10.0000 mg | ORAL_TABLET | Freq: Every evening | ORAL | Status: DC | PRN

## 2013-02-01 NOTE — Telephone Encounter (Signed)
May refill #30 Ambien 10 mg but make sure NOT to take concommittantly with the Ambien 12.5 mg

## 2013-02-01 NOTE — Telephone Encounter (Signed)
RX was called in.

## 2013-02-01 NOTE — Telephone Encounter (Signed)
Pt called and stated that is taking the Klonopin along with the Ambien. But with her work schedule she will take the Ambien 10mg . Pt stated that she had already mention this to you before.

## 2013-02-09 ENCOUNTER — Telehealth: Payer: Self-pay | Admitting: Family Medicine

## 2013-02-09 ENCOUNTER — Encounter: Payer: Self-pay | Admitting: Family Medicine

## 2013-02-09 MED ORDER — CLONAZEPAM 0.5 MG PO TABS: 0.5000 mg | ORAL_TABLET | Freq: Two times a day (BID) | ORAL | Status: DC | PRN

## 2013-02-09 NOTE — Telephone Encounter (Signed)
Pt states she sent mychart email b/c her bottle of clonazePAM (KLONOPIN) 0.5 MG tablet is GONE from her kitchen counter. Pt would like to know if dr Caryl Never would write her another rx, and will insurance will pay? Pharm: [facility]/ [address]/ Pt had just picked this up at the pharm

## 2013-02-09 NOTE — Telephone Encounter (Signed)
Insurance will probably not cover for early refill.  This medication must be kept under tight control.  We do not generally refill controlled medications early. OK to refill this once but we will be unable to refill early again.  I would like follow up within 6-8 weeks to review medications.

## 2013-02-09 NOTE — Telephone Encounter (Signed)
RX called into the pharmacy and patient is aware per Dr. Caryl Never

## 2013-02-28 ENCOUNTER — Other Ambulatory Visit: Payer: Self-pay | Admitting: Family Medicine

## 2013-02-28 NOTE — Telephone Encounter (Signed)
Last refill 02/09/13 #30 0

## 2013-03-01 NOTE — Telephone Encounter (Signed)
Refill once.  I would like to get follow up with her within one month to discuss meds.

## 2013-03-05 ENCOUNTER — Telehealth: Payer: Self-pay | Admitting: Family Medicine

## 2013-03-05 NOTE — Telephone Encounter (Addendum)
Pt was a new pt w/ dr Artist Pais 01/04/12.   Pt saw you while Dr Artist Pais was out for his extedned absence. Pt states she has switched to you now.  There is no phone note stating this.  Will you accept pt?

## 2013-03-11 NOTE — Telephone Encounter (Signed)
Yes.  I thought this has already been done.

## 2013-03-15 ENCOUNTER — Other Ambulatory Visit: Payer: Self-pay

## 2013-03-16 ENCOUNTER — Encounter: Payer: Self-pay | Admitting: Family Medicine

## 2013-03-16 ENCOUNTER — Ambulatory Visit (INDEPENDENT_AMBULATORY_CARE_PROVIDER_SITE_OTHER): Payer: 59 | Admitting: Family Medicine

## 2013-03-16 VITALS — BP 126/78 | HR 74 | Temp 98.3°F | Wt 182.0 lb

## 2013-03-16 DIAGNOSIS — G4700 Insomnia, unspecified: Secondary | ICD-10-CM

## 2013-03-16 DIAGNOSIS — F4321 Adjustment disorder with depressed mood: Secondary | ICD-10-CM

## 2013-03-16 DIAGNOSIS — F5104 Psychophysiologic insomnia: Secondary | ICD-10-CM

## 2013-03-16 MED ORDER — CLONAZEPAM 1 MG PO TABS: 1.0000 mg | ORAL_TABLET | Freq: Every day | ORAL | Status: DC

## 2013-03-16 NOTE — Progress Notes (Signed)
  Subjective:    Patient ID: Sandra [name], female    DOB: 12-27-1980, 32 y.o.   MRN: [id number]  HPI Followup regarding depression and anxiety Patient has had very stressful year. Father of her 69-year-old child left her in the past year. They're not looking at reconciliation at this point. Patient's had previous counseling. Dealing with stresses of full time job and raising 40 year old daughter.  She continues to struggle with sleep. She has had good success with Klonopin 1 mg and alternates sometimes between short-acting Ambien or Ambien CR. No alcohol use. She still has some depressed mood but no suicidal ideation. Takes sertraline 50 mg each bedtime. She tried increasing this to 100 mg but had some nausea.  She is not interested in exploring other antidepressants at this time.    Past Medical History  Diagnosis Date  . GERD (gastroesophageal reflux disease)   . SROM (spontaneous rupture of membranes) 01/31/2011  . Spontaneous vaginal delivery 02/01/2011  . Hypertension    Past Surgical History  Procedure Laterality Date  . Tonsillectomy      reports that she has never smoked. She has never used smokeless tobacco. She reports that she does not drink alcohol or use illicit drugs. family history includes Breast cancer in her paternal aunt; Cancer in her maternal grandmother, paternal aunt, and paternal grandfather; Diabetes type II in her father; Hypertension in her father; Multiple myeloma in her maternal grandmother. Allergies  Allergen Reactions  . Fluoxetine     Headache   . Sulfa Antibiotics Rash      Review of Systems  Constitutional: Negative for appetite change and unexpected weight change.       Objective:   Physical Exam  Constitutional: She appears well-developed and well-nourished.  Cardiovascular: Normal rate and regular rhythm.   Pulmonary/Chest: Effort normal and breath sounds normal. No respiratory distress. She has no wheezes. She has no rales.   Neurological: She is alert.  Psychiatric: She has a normal mood and affect. Her behavior is normal. Thought content normal.          Assessment & Plan:   #1 insomnia. Sleep hygiene previously discussed. Refill Klonopin 1 mg each bedtime prn. #2 adjustment disorder with depressed mood. Continue sertraline. We discussed again counseling which she's not interested in at this time. We discussed nonpharmacologic ways to help with mood such as exercise

## 2013-03-19 ENCOUNTER — Encounter: Payer: Self-pay | Admitting: Family Medicine

## 2013-04-12 ENCOUNTER — Encounter (HOSPITAL_COMMUNITY): Payer: Self-pay | Admitting: Emergency Medicine

## 2013-04-12 ENCOUNTER — Emergency Department (HOSPITAL_COMMUNITY)
Admission: EM | Admit: 2013-04-12 | Discharge: 2013-04-12 | Disposition: A | Discharge status: Home / Self Care | Payer: 59 | Source: Home / Self Care | Attending: Family Medicine | Admitting: Family Medicine

## 2013-04-12 DIAGNOSIS — J069 Acute upper respiratory infection, unspecified: Secondary | ICD-10-CM

## 2013-04-12 MED ORDER — IPRATROPIUM BROMIDE 0.06 % NA SOLN: 2.0000 | Freq: Four times a day (QID) | NASAL | Status: DC

## 2013-04-12 MED ORDER — AZITHROMYCIN 250 MG PO TABS: 250.0000 mg | ORAL_TABLET | Freq: Every day | ORAL | Status: DC

## 2013-04-12 MED ORDER — HYDROCODONE-ACETAMINOPHEN 5-325 MG PO TABS: 0.5000 | ORAL_TABLET | Freq: Every evening | ORAL | Status: DC | PRN

## 2013-04-12 NOTE — ED Provider Notes (Signed)
Sandra [name] is a 32 y.o. female who presents to Urgent Care today for cough congestion runny nose. This is been present for about 24 hours. She denies any shortness of breath or wheezing. She denies any nausea vomiting or diarrhea.   Past Medical History  Diagnosis Date  . GERD (gastroesophageal reflux disease)   . SROM (spontaneous rupture of membranes) 01/31/2011  . Spontaneous vaginal delivery 02/01/2011  . Hypertension    History  Substance Use Topics  . Smoking status: Never Smoker   . Smokeless tobacco: Never Used  . Alcohol Use: No   ROS as above Medications reviewed. No current facility-administered medications for this encounter.   Current Outpatient Prescriptions  Medication Sig Dispense Refill  . azithromycin (ZITHROMAX) 250 MG tablet Take 1 tablet (250 mg total) by mouth daily. Take first 2 tablets together, then 1 every day until finished.  6 tablet  0  . Calcium Carbonate-Vit D-Min (CALCIUM 1200 PO) Take 1 tablet by mouth daily as needed (Dietary supplement).       . clonazePAM (KLONOPIN) 1 MG tablet Take 1 tablet (1 mg total) by mouth at bedtime.  30 tablet  5  . famotidine (PEPCID) 10 MG tablet Take 10 mg by mouth daily as needed for heartburn.       Marland Kitchen HYDROcodone-acetaminophen (NORCO/VICODIN) 5-325 MG per tablet Take 0.5 tablets by mouth at bedtime as needed (cough).  8 tablet  0  . ipratropium (ATROVENT) 0.06 % nasal spray Place 2 sprays into both nostrils 4 (four) times daily.  15 mL  1  . sertraline (ZOLOFT) 50 MG tablet TAKE 1 TABLET BY MOUTH DAILY  30 tablet  5  . zolpidem (AMBIEN CR) 12.5 MG CR tablet Take 1 tablet (12.5 mg total) by mouth at bedtime as needed for sleep.  30 tablet  2  . zolpidem (AMBIEN) 10 MG tablet Take 1 tablet (10 mg total) by mouth at bedtime as needed for sleep.  30 tablet  0    Exam:  BP 145/86  Pulse 76  Temp(Src) 98.7 F (37.1 C) (Oral)  Resp 20  SpO2 100%  LMP 04/04/2013 Gen: Well NAD HEENT: EOMI,  MMM Lungs: Normal  work of breathing. CTABL. Frequent coughing Heart: RRR no MRG Abd: NABS, Soft. NT, ND Exts: Non edematous BL  LE, warm and well perfused.   No results found for this or any previous visit (from the past 24 hour(s)). No results found.  Assessment and Plan: 32 y.o. female with   viral URI with cough.  Plan to treat symptomatically with Atrovent nasal spray, Norco for cough, azithromycin if not getting better.  Call or go to the emergency room if you get worse, have trouble breathing, have chest pains, or palpitations.      Rodolph Bong, MD 04/12/13 [phone]

## 2013-04-12 NOTE — ED Notes (Signed)
Pt  Reports  Symptoms  Of  Cough     /  Congestion       With  Pain in  Her  cghest  From  Coughing     Symptoms  Not  releived  By  otc  meds  Symptoms  Started      Yesterday

## 2013-04-13 ENCOUNTER — Telehealth: Payer: Self-pay | Admitting: Family Medicine

## 2013-04-13 ENCOUNTER — Encounter: Payer: Self-pay | Admitting: Family Medicine

## 2013-04-13 ENCOUNTER — Other Ambulatory Visit: Payer: Self-pay

## 2013-04-13 MED ORDER — HYDROCOD POLST-CHLORPHEN POLST 10-8 MG/5ML PO LQCR: 5.0000 mL | Freq: Two times a day (BID) | ORAL | Status: DC | PRN

## 2013-04-13 NOTE — Telephone Encounter (Signed)
Pt is requesting a rx for cough med. Pt ws seen in UC yesterday. Pt was given azithromycin (ZITHROMAX) 250 MG tablet Pt was told only to take if she did not get better. MD thinks it may be viral. Can you see if another md will write for her?

## 2013-04-13 NOTE — Telephone Encounter (Signed)
Rx for tussionex is written for the patient. Per [place]onda. Left message on patient VM that RX is ready for pick up

## 2013-04-14 ENCOUNTER — Encounter: Payer: Self-pay | Admitting: Family Medicine

## 2013-04-14 ENCOUNTER — Ambulatory Visit (INDEPENDENT_AMBULATORY_CARE_PROVIDER_SITE_OTHER): Payer: 59 | Admitting: Family Medicine

## 2013-04-14 VITALS — BP 140/82 | HR 94 | Temp 100.9°F | Resp 20 | Wt 184.1 lb

## 2013-04-14 DIAGNOSIS — J111 Influenza due to unidentified influenza virus with other respiratory manifestations: Secondary | ICD-10-CM

## 2013-04-14 MED ORDER — OSELTAMIVIR PHOSPHATE 75 MG PO CAPS: 75.0000 mg | ORAL_CAPSULE | Freq: Every day | ORAL | Status: DC

## 2013-04-14 NOTE — Progress Notes (Signed)
Subjective:    Patient ID: Sandra [name], female    DOB: 02-16-1981, 32 y.o.   MRN: [id number]  HPI Started getting sick last week with uri symptoms  Went to UC- given zpack to hold - and last night fever started so she started the zpak Has bad aching all over  Has tussionex  Took cough syrup last night - brown and yellow gunk today   No wheezing or trouble breathing  Taking ibuprofen for fever and aches - the body aches are severe   Highest fever 101.4 last night  She is very worried she may have the flu   Some post nasal drainage and cong with ST also   Patient Active Problem List   Diagnosis Date Noted  . Influenza 05/25/2012  . Enlarged lymph node 03/06/2012  . Urinary frequency 01/04/2012  . Chronic insomnia 01/04/2012  . Elevated blood pressure reading without diagnosis of hypertension 01/04/2012  . Spontaneous vaginal delivery 02/01/2011  . SROM (spontaneous rupture of membranes) 01/31/2011   Past Medical History  Diagnosis Date  . GERD (gastroesophageal reflux disease)   . SROM (spontaneous rupture of membranes) 01/31/2011  . Spontaneous vaginal delivery 02/01/2011  . Hypertension    Past Surgical History  Procedure Laterality Date  . Tonsillectomy     History  Substance Use Topics  . Smoking status: Never Smoker   . Smokeless tobacco: Never Used  . Alcohol Use: No   Family History  Problem Relation Age of Onset  . Hypertension Father   . Diabetes type II Father     Mild diabetes/prediabetes  . Breast cancer Paternal Aunt   . Cancer Paternal Aunt     cervical OR ovarian  . Multiple myeloma Maternal Grandmother   . Cancer Maternal Grandmother     multiple myeloma  . Cancer Paternal Grandfather     prostate   Allergies  Allergen Reactions  . Fluoxetine     Headache   . Sulfa Antibiotics Rash   Current Outpatient Prescriptions on File Prior to Visit  Medication Sig Dispense Refill  . azithromycin (ZITHROMAX) 250 MG tablet Take 1 tablet  (250 mg total) by mouth daily. Take first 2 tablets together, then 1 every day until finished.  6 tablet  0  . Calcium Carbonate-Vit D-Min (CALCIUM 1200 PO) Take 1 tablet by mouth daily as needed (Dietary supplement).       . chlorpheniramine-HYDROcodone (TUSSIONEX) 10-8 MG/5ML LQCR Take 5 mLs by mouth every 12 (twelve) hours as needed for cough.  115 mL  0  . clonazePAM (KLONOPIN) 1 MG tablet Take 1 tablet (1 mg total) by mouth at bedtime.  30 tablet  5  . famotidine (PEPCID) 10 MG tablet Take 10 mg by mouth daily as needed for heartburn.       Marland Kitchen ipratropium (ATROVENT) 0.06 % nasal spray Place 2 sprays into both nostrils 4 (four) times daily.  15 mL  1  . sertraline (ZOLOFT) 50 MG tablet TAKE 1 TABLET BY MOUTH DAILY  30 tablet  5  . zolpidem (AMBIEN CR) 12.5 MG CR tablet Take 1 tablet (12.5 mg total) by mouth at bedtime as needed for sleep.  30 tablet  2  . HYDROcodone-acetaminophen (NORCO/VICODIN) 5-325 MG per tablet Take 0.5 tablets by mouth at bedtime as needed (cough).  8 tablet  0  . zolpidem (AMBIEN) 10 MG tablet Take 1 tablet (10 mg total) by mouth at bedtime as needed for sleep.  30 tablet  0  No current facility-administered medications on file prior to visit.     Review of Systems Review of Systems  Constitutional: Negative for  unexpected weight change. pos for body aches/ fever/malaise  Eyes: Negative for pain and visual disturbance ENT pos for cong and rhinorrhea and ST, neg for sinus pain .  Respiratory: Negative for wheeze  and shortness of breath.   Cardiovascular: Negative for cp or palpitations    Gastrointestinal: Negative for nausea, diarrhea and constipation.  Genitourinary: Negative for urgency and frequency.  Skin: Negative for pallor or rash   Neurological: Negative for weakness, light-headedness, numbness and headaches.  Hematological: Negative for adenopathy. Does not bruise/bleed easily.  Psychiatric/Behavioral: Negative for dysphoric mood. The patient is not  nervous/anxious.         Objective:   Physical Exam  Constitutional: She appears well-developed and well-nourished. No distress.  Fatigued appearing   HENT:  Head: Normocephalic and atraumatic.  Right Ear: External ear normal.  Left Ear: External ear normal.  Mouth/Throat: Oropharynx is clear and moist.  Nares are injected and congested   No sinus tenderness Clear post nasal drip    Eyes: Conjunctivae and EOM are normal. Pupils are equal, round, and reactive to light. Right eye exhibits no discharge. Left eye exhibits no discharge. No scleral icterus.  Neck: Normal range of motion. Neck supple.  Cardiovascular: Regular rhythm and normal heart sounds.   Pulmonary/Chest: Effort normal and breath sounds normal. No respiratory distress. She has no wheezes. She has no rales.  Harsh / dry hacking cough Mild hoarseness   Abdominal: Soft. Bowel sounds are normal.  Lymphadenopathy:    She has no cervical adenopathy.  Neurological: She is alert. She has normal reflexes.  Skin: Skin is warm and dry. No rash noted.  Psychiatric: She has a normal mood and affect.          Assessment & Plan:

## 2013-04-14 NOTE — Patient Instructions (Signed)
I think you may have the flu plus or minus another respiratory bug  Take the medicine you have  Take tamiflu as directed  Get lots of rest and fluids  Ibuprofen or aleve for fever and aches  If much worse / or any trouble breathing - please let us know or seek care if after hours

## 2013-04-14 NOTE — Progress Notes (Signed)
Pre-visit discussion using our clinic review tool. No additional management support is needed unless otherwise documented below in the visit note.  

## 2013-04-15 NOTE — Assessment & Plan Note (Signed)
Pt being tx for uri and just started zpak- but given harsh cough and high fever - suspect influenza Cover with tamiflu  Disc symptomatic care - see instructions on AVS - disc control of fever to help body aches  Update if not starting to improve in a week or if worsening

## 2013-04-26 ENCOUNTER — Encounter: Payer: Self-pay | Admitting: Family Medicine

## 2013-04-27 ENCOUNTER — Other Ambulatory Visit: Payer: Self-pay

## 2013-04-27 MED ORDER — ZOLPIDEM TARTRATE 10 MG PO TABS: 10.0000 mg | ORAL_TABLET | Freq: Every evening | ORAL | Status: DC | PRN

## 2013-04-28 ENCOUNTER — Emergency Department (INDEPENDENT_AMBULATORY_CARE_PROVIDER_SITE_OTHER): Payer: 59

## 2013-04-28 ENCOUNTER — Emergency Department (HOSPITAL_COMMUNITY)
Admission: EM | Admit: 2013-04-28 | Discharge: 2013-04-28 | Disposition: A | Discharge status: Home / Self Care | Payer: 59 | Source: Home / Self Care | Attending: Emergency Medicine | Admitting: Emergency Medicine

## 2013-04-28 ENCOUNTER — Encounter (HOSPITAL_COMMUNITY): Payer: Self-pay | Admitting: Emergency Medicine

## 2013-04-28 DIAGNOSIS — J45909 Unspecified asthma, uncomplicated: Secondary | ICD-10-CM

## 2013-04-28 DIAGNOSIS — J329 Chronic sinusitis, unspecified: Secondary | ICD-10-CM

## 2013-04-28 MED ORDER — SODIUM CHLORIDE 0.9 % IN NEBU
INHALATION_SOLUTION | RESPIRATORY_TRACT | Status: AC
  Filled 2013-04-28: qty 3

## 2013-04-28 MED ORDER — METHYLPREDNISOLONE ACETATE 80 MG/ML IJ SUSP
160.0000 mg | Freq: Once | INTRAMUSCULAR | Status: AC
  Administered 2013-04-28: 160 mg via INTRAMUSCULAR

## 2013-04-28 MED ORDER — ONDANSETRON HCL 4 MG/2ML IJ SOLN: 4.0000 mg | Freq: Once | INTRAMUSCULAR | Status: DC

## 2013-04-28 MED ORDER — ONDANSETRON 4 MG PO TBDP: 8.0000 mg | ORAL_TABLET | Freq: Once | ORAL | Status: DC

## 2013-04-28 MED ORDER — METHYLPREDNISOLONE ACETATE 80 MG/ML IJ SUSP
INTRAMUSCULAR | Status: AC
  Filled 2013-04-28: qty 2

## 2013-04-28 MED ORDER — IPRATROPIUM BROMIDE 0.02 % IN SOLN
RESPIRATORY_TRACT | Status: AC
  Filled 2013-04-28: qty 2.5

## 2013-04-28 MED ORDER — HYDROCOD POLST-CHLORPHEN POLST 10-8 MG/5ML PO LQCR: 5.0000 mL | Freq: Two times a day (BID) | ORAL | Status: DC | PRN

## 2013-04-28 MED ORDER — AMOXICILLIN-POT CLAVULANATE 875-125 MG PO TABS: 1.0000 | ORAL_TABLET | Freq: Two times a day (BID) | ORAL | Status: DC

## 2013-04-28 MED ORDER — ALBUTEROL SULFATE (5 MG/ML) 0.5% IN NEBU
INHALATION_SOLUTION | RESPIRATORY_TRACT | Status: AC
  Filled 2013-04-28: qty 1

## 2013-04-28 MED ORDER — ALBUTEROL SULFATE (5 MG/ML) 0.5% IN NEBU
5.0000 mg | INHALATION_SOLUTION | Freq: Once | RESPIRATORY_TRACT | Status: AC
  Administered 2013-04-28: 5 mg via RESPIRATORY_TRACT

## 2013-04-28 MED ORDER — IPRATROPIUM BROMIDE 0.02 % IN SOLN
0.5000 mg | Freq: Once | RESPIRATORY_TRACT | Status: AC
  Administered 2013-04-28: 0.5 mg via RESPIRATORY_TRACT

## 2013-04-28 MED ORDER — ALBUTEROL SULFATE HFA 108 (90 BASE) MCG/ACT IN AERS: 1.0000 | INHALATION_SPRAY | Freq: Four times a day (QID) | RESPIRATORY_TRACT | Status: AC | PRN

## 2013-04-28 NOTE — ED Provider Notes (Signed)
Chief Complaint:   Chief Complaint  Patient presents with  . 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 She also notes sinus pressure and sore throat which he thinks is due to the severe cough. She's been coughing continuously day and night and this keeps her awake at nighttime. She was seen here about 2 weeks ago and given a Z-Pak, but she feels no better.  Review of Systems:  Other than noted above, the patient denies any of the following symptoms: Systemic:  No fevers, chills, sweats, weight loss or gain, fatigue, or tiredness. Eye:  No redness or discharge. ENT:  No ear pain, drainage, headache, nasal congestion, drainage, sinus pressure, difficulty swallowing, or sore throat. Neck:  No neck pain or swollen glands. Lungs:  No cough, sputum production, hemoptysis, wheezing, chest tightness, shortness of breath or chest pain. GI:  No abdominal pain, nausea, vomiting or diarrhea.  PMFSH:  Past medical history, family history, social history, meds, and allergies were reviewed. She is allergic to sulfa, Prozac, and codeine although she can take hydrocodone. She takes Zoloft, Ambien, and clonazepam. She has a history of depression and anxiety.  Physical Exam:   Vital signs:  BP 124/83  Pulse 75  Temp(Src) 98 F (36.7 C) (Oral)  Resp 16  SpO2 99%  LMP 04/04/2013 General:  Alert and oriented.  In no distress.  Skin warm and dry. She was coughing continuously. No wheezing or respiratory distress. Eye:  No conjunctival injection or drainage. Lids were normal. ENT:  TMs and canals were normal, without erythema or inflammation.  Nasal mucosa was clear and uncongested, without drainage.  Mucous membranes were moist.  Pharynx was clear with no exudate or drainage.   There were no oral ulcerations or lesions. Neck:  Supple, no adenopathy, tenderness or mass. Lungs:  No respiratory distress.  Lungs were clear to auscultation, without wheezes, rales or rhonchi.  Breath sounds were clear and equal bilaterally. On forced expiration she has loud wheezes and rhonchi. Heart:  Regular rhythm, without gallops, murmers or rubs. Skin:  Clear, warm, and dry, without rash or lesions.  Radiology:  Dg Chest 2 View  04/28/2013   CLINICAL DATA:  Productive cough for 3 weeks.  EXAM: CHEST  2 VIEW  COMPARISON:  12/23/2012.  FINDINGS: Cardiopericardial silhouette within normal limits. Mediastinal contours normal. Trachea midline. No airspace disease or effusion.  IMPRESSION: No active cardiopulmonary disease.   Electronically Signed   By: Andreas Newport M.D.   On: 04/28/2013 09:50    Course in Urgent Care Center:   She was given a DuoNeb breathing treatment. She did feel a bit better but this caused some tremor and some nausea and she vomited a couple times. She was given Zofran sublingually, and Depo-Medrol 160 mg IM.  Assessment:  The primary encounter diagnosis was Sinusitis. A diagnosis of Reactive airway disease was also pertinent to this visit.  Plan:   1.  Meds:  The following meds were prescribed:   New Prescriptions   ALBUTEROL (PROVENTIL HFA;VENTOLIN HFA) 108 (90 BASE) MCG/ACT INHALER    Inhale 1-2 puffs into the lungs every 6 (six) hours as needed for wheezing or shortness of breath.   AMOXICILLIN-CLAVULANATE (AUGMENTIN) 875-125 MG PER TABLET    Take 1  tablet by mouth 2 (two) times daily.   CHLORPHENIRAMINE-HYDROCODONE (TUSSIONEX) 10-8 MG/5ML LQCR    Take 5 mLs by mouth every 12 (twelve) hours as needed for cough.    2.  Patient Education/Counseling:  The patient was given appropriate handouts, self care instructions, and instructed in symptomatic relief.  3.  Follow up:  The patient was told to follow up if no better in 3 to 4 days, if becoming worse in any  way, and given some red flag symptoms such as worsening difficulty breathing which would prompt immediate return.  Follow up here as needed.      Reuben Likes, MD 04/28/13 [phone]

## 2013-04-28 NOTE — ED Notes (Signed)
Cough syx continuing since 12- 4 [facility] visit;

## 2013-05-08 ENCOUNTER — Other Ambulatory Visit: Payer: Self-pay | Admitting: Family Medicine

## 2013-05-13 ENCOUNTER — Encounter: Payer: Self-pay | Admitting: Family Medicine

## 2013-05-14 ENCOUNTER — Encounter: Payer: Self-pay | Admitting: Family Medicine

## 2013-05-14 ENCOUNTER — Emergency Department (HOSPITAL_COMMUNITY)
Admission: EM | Admit: 2013-05-14 | Discharge: 2013-05-14 | Disposition: A | Discharge status: Home / Self Care | Payer: 59 | Attending: Emergency Medicine | Admitting: Emergency Medicine

## 2013-05-14 ENCOUNTER — Other Ambulatory Visit: Payer: Self-pay | Admitting: Family Medicine

## 2013-05-14 DIAGNOSIS — Z791 Long term (current) use of non-steroidal anti-inflammatories (NSAID): Secondary | ICD-10-CM | POA: Insufficient documentation

## 2013-05-14 DIAGNOSIS — I10 Essential (primary) hypertension: Secondary | ICD-10-CM | POA: Insufficient documentation

## 2013-05-14 DIAGNOSIS — M5432 Sciatica, left side: Secondary | ICD-10-CM

## 2013-05-14 DIAGNOSIS — M545 Low back pain, unspecified: Secondary | ICD-10-CM | POA: Insufficient documentation

## 2013-05-14 DIAGNOSIS — M5430 Sciatica, unspecified side: Secondary | ICD-10-CM | POA: Insufficient documentation

## 2013-05-14 DIAGNOSIS — K219 Gastro-esophageal reflux disease without esophagitis: Secondary | ICD-10-CM | POA: Insufficient documentation

## 2013-05-14 DIAGNOSIS — Z79899 Other long term (current) drug therapy: Secondary | ICD-10-CM | POA: Insufficient documentation

## 2013-05-14 DIAGNOSIS — IMO0002 Reserved for concepts with insufficient information to code with codable children: Secondary | ICD-10-CM | POA: Insufficient documentation

## 2013-05-14 DIAGNOSIS — Z8739 Personal history of other diseases of the musculoskeletal system and connective tissue: Secondary | ICD-10-CM | POA: Insufficient documentation

## 2013-05-14 DIAGNOSIS — Z3202 Encounter for pregnancy test, result negative: Secondary | ICD-10-CM | POA: Insufficient documentation

## 2013-05-14 DIAGNOSIS — R110 Nausea: Secondary | ICD-10-CM | POA: Insufficient documentation

## 2013-05-14 LAB — URINALYSIS, ROUTINE W REFLEX MICROSCOPIC
Bilirubin Urine: NEGATIVE
Glucose, UA: NEGATIVE mg/dL
Hgb urine dipstick: NEGATIVE
Ketones, ur: NEGATIVE mg/dL
Nitrite: NEGATIVE
Protein, ur: NEGATIVE mg/dL
Specific Gravity, Urine: 1.007 (ref 1.005–1.030)
UROBILINOGEN UA: 0.2 mg/dL (ref 0.0–1.0)
pH: 7 (ref 5.0–8.0)

## 2013-05-14 LAB — URINE MICROSCOPIC-ADD ON

## 2013-05-14 LAB — POCT PREGNANCY, URINE: PREG TEST UR: NEGATIVE

## 2013-05-14 MED ORDER — OXYCODONE-ACETAMINOPHEN 5-325 MG PO TABS
2.0000 | ORAL_TABLET | Freq: Once | ORAL | Status: AC
  Administered 2013-05-14: 2 via ORAL
  Filled 2013-05-14: qty 2

## 2013-05-14 MED ORDER — PREDNISONE 20 MG PO TABS: 40.0000 mg | ORAL_TABLET | Freq: Every day | ORAL | Status: DC

## 2013-05-14 MED ORDER — KETOROLAC TROMETHAMINE 60 MG/2ML IM SOLN
60.0000 mg | Freq: Once | INTRAMUSCULAR | Status: AC
  Administered 2013-05-14: 60 mg via INTRAMUSCULAR
  Filled 2013-05-14: qty 2

## 2013-05-14 MED ORDER — OXYCODONE-ACETAMINOPHEN 5-325 MG PO TABS: 1.0000 | ORAL_TABLET | Freq: Four times a day (QID) | ORAL | Status: DC | PRN

## 2013-05-14 MED ORDER — MELOXICAM 7.5 MG PO TABS: 15.0000 mg | ORAL_TABLET | Freq: Every day | ORAL | Status: DC

## 2013-05-14 MED ORDER — DEXAMETHASONE SODIUM PHOSPHATE 10 MG/ML IJ SOLN
10.0000 mg | Freq: Once | INTRAMUSCULAR | Status: AC
  Administered 2013-05-14: 10 mg via INTRAMUSCULAR
  Filled 2013-05-14: qty 1

## 2013-05-14 NOTE — ED Notes (Signed)
Pt states that she started getting L lower back and leg pain and some nausea approx. 1 1/2 hours ago. Denies hx of back pain.

## 2013-05-14 NOTE — Telephone Encounter (Signed)
Last visit 04/14/13 Last refill 01/31/13 #30 2 refill

## 2013-05-14 NOTE — ED Provider Notes (Signed)
CSN: [id number]     Arrival date & time 05/14/13  1843 History  This chart was scribed for non-physician practitioner working with No att. providers found by Vernell Barrier, ED scribe. This patient was seen in room WTR8/WTR8 and the patient's care was started at 8:13 PM.   Chief Complaint  Patient presents with  . Back Pain  . Leg Pain   Patient is a 33 y.o. female presenting with back pain and leg pain. The history is provided by the patient. No language interpreter was used.  Back Pain Location:  Lumbar spine Quality:  Shooting Radiates to:  L knee and L thigh Pain severity:  Severe Pain is:  Same all the time Onset quality:  Sudden Duration:  3 hours Timing:  Constant Progression:  Waxing and waning Chronicity:  New Relieved by:  Being still Worsened by:  Movement, twisting and sitting Associated symptoms: leg pain   Associated symptoms: no abdominal pain, no fever and no numbness   Leg Pain Associated symptoms: back pain   Associated symptoms: no fever    HPI Comments: Elida Harbin Arlen is a 33 y.o. female who presents to the Emergency Department complaining of sudden onset constant, shooting lower back pain radiating to left leg to knee, onset 3 hours ago. Pt states she was just driving and pain started. She states pain was worse earlier but is now not as severe as initial onset; shooting pain present. Worsened with movement and walking. Pt states pain is relieved by removing the pressure off that side. Pt reports numbness at initial onset but states that has since resolved. Pt is able to walk but exhibits some difficulty. Pt states she took ibuprofen with no relief. Pt reports some associated nausea. Pt denies any injury related to the pain. No fever, chills, abdominal pain, hematuria, burning with urination, dyspareunia, vaginal bleeding, numbness around anus or vagina, bowel/bladder incontinence. Pt denies any hx of back pain. Pt has a hx of stress incontinence. Pt has a hx of  ovarian cysts bursting.  Past Medical History  Diagnosis Date  . GERD (gastroesophageal reflux disease)   . SROM (spontaneous rupture of membranes) 01/31/2011  . Spontaneous vaginal delivery 02/01/2011  . Hypertension    Past Surgical History  Procedure Laterality Date  . Tonsillectomy     Family History  Problem Relation Age of Onset  . Hypertension Father   . Diabetes type II Father     Mild diabetes/prediabetes  . Breast cancer Paternal Aunt   . Cancer Paternal Aunt     cervical OR ovarian  . Multiple myeloma Maternal Grandmother   . Cancer Maternal Grandmother     multiple myeloma  . Cancer Paternal Grandfather     prostate   History  Substance Use Topics  . Smoking status: Never Smoker   . Smokeless tobacco: Never Used  . Alcohol Use: No   OB History   Grav Para Term Preterm Abortions TAB SAB Ect Mult Living   1 1 1       1      Review of Systems  Constitutional: Negative for fever and chills.  Gastrointestinal: Positive for nausea. Negative for abdominal pain.  Genitourinary: Negative for hematuria, vaginal discharge, difficulty urinating, vaginal pain and dyspareunia.  Musculoskeletal: Positive for back pain.       Left leg pain  Neurological: Negative for numbness.  All other systems reviewed and are negative.    Allergies  Codeine; Fluoxetine; and Sulfa antibiotics  Home Medications  Current Outpatient Rx  Name  Route  Sig  Dispense  Refill  . Calcium Carbonate-Vit D-Min (CALCIUM 1200 PO)   Oral   Take 1 tablet by mouth daily as needed (Dietary supplement).          . clonazePAM (KLONOPIN) 1 MG tablet   Oral   Take 1 tablet (1 mg total) by mouth at bedtime.   30 tablet   5   . famotidine (PEPCID) 10 MG tablet   Oral   Take 10 mg by mouth daily as needed for heartburn.          . sertraline (ZOLOFT) 50 MG tablet      TAKE 1 TABLET BY MOUTH DAILY   30 tablet   5   . zolpidem (AMBIEN) 10 MG tablet   Oral   Take 1 tablet (10 mg  total) by mouth at bedtime as needed for sleep.   30 tablet   3   . albuterol (PROVENTIL HFA;VENTOLIN HFA) 108 (90 BASE) MCG/ACT inhaler   Inhalation   Inhale 1-2 puffs into the lungs every 6 (six) hours as needed for wheezing or shortness of breath.   1 Inhaler   0   . meloxicam (MOBIC) 7.5 MG tablet   Oral   Take 2 tablets (15 mg total) by mouth daily.   30 tablet   0   . oxyCODONE-acetaminophen (PERCOCET/ROXICET) 5-325 MG per tablet   Oral   Take 1-2 tablets by mouth every 6 (six) hours as needed for severe pain.   15 tablet   0   . predniSONE (DELTASONE) 20 MG tablet   Oral   Take 2 tablets (40 mg total) by mouth daily.   10 tablet   0   . zolpidem (AMBIEN CR) 12.5 MG CR tablet      TAKE 1 TABLET BY MOUTH AT BEDTIME AS NEEDED FOR SLEEP   30 tablet   2    Triage Vitals: BP 153/81  Pulse 83  Temp(Src) 97.8 F (36.6 C) (Oral)  SpO2 100%  LMP 05/09/2013 Physical Exam  Nursing note and vitals reviewed. Constitutional: She is oriented to person, place, and time. She appears well-developed and well-nourished. No distress.  HENT:  Head: Normocephalic and atraumatic.  Eyes: Conjunctivae and EOM are normal. No scleral icterus.  Neck: Normal range of motion.  Cardiovascular:  DP and PT reflexes 2+ bilaterally.  Pulmonary/Chest: Effort normal. No respiratory distress.  Abdominal: Soft. She exhibits no distension. There is no tenderness. There is no rebound and no guarding.  Musculoskeletal: Normal range of motion.  Tenderness to palpation to the left lumbosacral and paraspinal muscles Neg straight leg raise and cross straight leg raise  Neurological: She is alert and oriented to person, place, and time.  No gross sensory deficits in bilateral extremities. 2+ patellar and achilles reflexes bilaterally  Skin: Skin is warm and dry. No rash noted. She is not diaphoretic. No erythema. No pallor.  Psychiatric: She has a normal mood and affect. Her behavior is normal.     ED Course  Procedures (including critical care time) DIAGNOSTIC STUDIES: Oxygen Saturation is 100% on room air, normal by my interpretation.    COORDINATION OF CARE: At 8:20 PM: Discussed treatment plan with patient which includes pain medication and anti-inflammatory medication. Patient agrees.   Labs Review Labs Reviewed  URINALYSIS, ROUTINE W REFLEX MICROSCOPIC - Abnormal; Notable for the following:    Leukocytes, UA TRACE (*)    All other components within normal  limits  URINE MICROSCOPIC-ADD ON  POCT PREGNANCY, URINE   Imaging Review No results found.  EKG Interpretation   None       MDM   1. Low back pain   2. Sciatica of left side    Uncomplicated low-back pain. Patient well and nontoxic appearing, hemodynamically stable, and ambulatory with antalgic gait. She is neurovascularly intact with no sensation deficits. Pain improved with Toradol and Decadron. Urine unremarkable and urine pregnancy negative. No red flags or signs concerning for cauda equina. No history of cancer or IV drug use. Patient stable for discharge with prescription for Mobic, Percocet, and prednisone. Orthopedic followup advised. Return precautions discussed and patient agreeable to plan with no unaddressed concerns.  I personally performed the services described in this documentation, which was scribed in my presence. The recorded information has been reviewed and is accurate.       Antonietta Breach, PA-C 05/23/13 0700

## 2013-05-14 NOTE — Discharge Instructions (Signed)
Recommend that you refrain from strenuous activity and heavy lifting until symptoms begin to improve. Take Mobic and Prednisone for symptoms and Percocet as needed for pain control. Apply ice to your back at least 4 times a day for at least 30 minutes each time for at least the next 72 hours. Follow up with orthopedics for further evaluation of your symptoms. Return if symptoms worsen such as if you develop numbness of your genitals, numbness to your inner thighs, an inability to walk, or bowel/bladder incontinence.  Back Pain, Adult Back pain is very common. The pain often gets better over time. The cause of back pain is usually not dangerous. Most people can learn to manage their back pain on their own.  HOME CARE   Stay active. Start with short walks on flat ground if you can. Try to walk farther each day.  Do not sit, drive, or stand in one place for more than 30 minutes. Do not stay in bed.  Do not avoid exercise or work. Activity can help your back heal faster.  Be careful when you bend or lift an object. Bend at your knees, keep the object close to you, and do not twist.  Sleep on a firm mattress. Lie on your side, and bend your knees. If you lie on your back, put a pillow under your knees.  Only take medicines as told by your doctor.  Put ice on the injured area.  Put ice in a plastic bag.  Place a towel between your skin and the bag.  Leave the ice on for 15-20 minutes, 03-04 times a day for the first 2 to 3 days. After that, you can switch between ice and heat packs.  Ask your doctor about back exercises or massage.  Avoid feeling anxious or stressed. Find good ways to deal with stress, such as exercise. GET HELP RIGHT AWAY IF:   Your pain does not go away with rest or medicine.  Your pain does not go away in 1 week.  You have new problems.  You do not feel well.  The pain spreads into your legs.  You cannot control when you poop (bowel movement) or pee  (urinate).  Your arms or legs feel weak or lose feeling (numbness).  You feel sick to your stomach (nauseous) or throw up (vomit).  You have belly (abdominal) pain.  You feel like you may pass out (faint). MAKE SURE YOU:   Understand these instructions.  Will watch your condition.  Will get help right away if you are not doing well or get worse. Document Released: 10/13/2007 Document Revised: 07/19/2011 Document Reviewed: 09/14/2010 [name] Patient Information 2014 [employer], [name] Sciatica is pain, weakness, numbness, or tingling along your sciatic nerve. The nerve starts in the lower back and runs down the back of each leg. Nerve damage or certain conditions pinch or put pressure on the sciatic nerve. This causes the pain, weakness, and other discomforts of sciatica. HOME CARE   Only take medicine as told by your doctor.  Apply ice to the affected area for 20 minutes. Do this 3 4 times a day for the first 48 72 hours. Then try heat in the same way.  Exercise, stretch, or do your usual activities if these do not make your pain worse.  Go to physical therapy as told by your doctor.  Keep all doctor visits as told.  Do not wear high heels or shoes that are not supportive.  Get a firm mattress  if your mattress is too soft to lessen pain and discomfort. GET HELP RIGHT AWAY IF:   You cannot control when you poop (bowel movement) or pee (urinate).  You have more weakness in your lower back, lower belly (pelvis), butt (buttocks), or legs.  You have redness or puffiness (swelling) of your back.  You have a burning feeling when you pee.  You have pain that gets worse when you lie down.  You have pain that wakes you from your sleep.  Your pain is worse than past pain.  Your pain lasts longer than 4 weeks.  You are suddenly losing weight without reason. MAKE SURE YOU:   Understand these instructions.  Will watch this condition.  Will get help right away if  you are not doing well or get worse. Document Released: 02/03/2008 Document Revised: 10/26/2011 Document Reviewed: 09/05/2011 [name] Patient Information 2014 [name], [name].

## 2013-05-14 NOTE — Telephone Encounter (Signed)
Refill for 3 months. 

## 2013-05-15 ENCOUNTER — Encounter: Payer: Self-pay | Admitting: Family Medicine

## 2013-05-15 ENCOUNTER — Encounter (HOSPITAL_COMMUNITY): Payer: Self-pay | Admitting: Emergency Medicine

## 2013-05-15 ENCOUNTER — Emergency Department (HOSPITAL_COMMUNITY)
Admission: EM | Admit: 2013-05-15 | Discharge: 2013-05-15 | Disposition: A | Discharge status: Home / Self Care | Payer: 59 | Source: Home / Self Care | Attending: Family Medicine | Admitting: Family Medicine

## 2013-05-15 DIAGNOSIS — M5442 Lumbago with sciatica, left side: Secondary | ICD-10-CM

## 2013-05-15 DIAGNOSIS — M5430 Sciatica, unspecified side: Secondary | ICD-10-CM

## 2013-05-15 MED ORDER — KETOROLAC TROMETHAMINE 30 MG/ML IJ SOLN: 30.0000 mg | Freq: Once | INTRAMUSCULAR | Status: DC

## 2013-05-15 NOTE — ED Notes (Signed)
C/o severe lower back pain that radiates down left leg.  Was seen earlier at the ER. Given mobic and prednisone with no relief.

## 2013-05-15 NOTE — ED Notes (Addendum)
Transfer not completed patient refused to leave unless she talked with medical director Dr. Artis FlockKindl.  Pt was offended when confronted with several narcotic prescriptions/controlled substance that she was receiving at several different addresses.  Dr. Artis Flockkindl unable to speak with her due to seeing patients. Dr. Artis Flockkindl requested to have security called to have patient leave.  Mw,cma

## 2013-05-15 NOTE — ED Provider Notes (Signed)
CSN: [id number]     Arrival date & time 05/15/13  1845 History   First MD Initiated Contact with Patient 05/15/13 1933     Chief Complaint  Patient presents with  . Back Pain   (Consider location/radiation/quality/duration/timing/severity/associated sxs/prior Treatment) HPI Comments: Patient presents for lower back pain that began gradually 3 days ago after taking an exercise class. Pain became worse on 05/14/2013 after lifting her daughter. This prompted a visit to the [facility] on 05/14/2012 where she was diagnosed with sciatica and prescribed percocet, meloxicam and prednisone. Urine studies were normal and UPT was negative. She began taking these medications as prescribed over the past 24 hours, however, her pain continued to worsen. Therefore she presents to the UC for re-evaluation. Denies any new injury, fever, bowel or bladder incontinence. States pain begins in her lower back with sharp radicular pain to her left buttock and left posterior thing. Denies changes in sensation of left lower extremity, but does report that while at work today pain intensified significantly and at one point she felt that her left leg "gave out on her" for a brief period of time.  Patient is a 33 y.o. female presenting with back pain. The history is provided by the patient.  Back Pain Location:  Lumbar spine Associated symptoms: no dysuria, no fever, no headaches, no numbness, no pelvic pain and no weakness     Past Medical History  Diagnosis Date  . GERD (gastroesophageal reflux disease)   . SROM (spontaneous rupture of membranes) 01/31/2011  . Spontaneous vaginal delivery 02/01/2011  . Hypertension    Past Surgical History  Procedure Laterality Date  . Tonsillectomy     Family History  Problem Relation Age of Onset  . Hypertension Father   . Diabetes type II Father     Mild diabetes/prediabetes  . Breast cancer Paternal Aunt   . Cancer Paternal Aunt     cervical OR ovarian  . Multiple myeloma  Maternal Grandmother   . Cancer Maternal Grandmother     multiple myeloma  . Cancer Paternal Grandfather     prostate   History  Substance Use Topics  . Smoking status: Never Smoker   . Smokeless tobacco: Never Used  . Alcohol Use: No   OB History   Grav Para Term Preterm Abortions TAB SAB Ect Mult Living   1 1 1       1      Review of Systems  Constitutional: Negative for fever, chills and fatigue.  HENT: Negative.   Respiratory: Negative.   Cardiovascular: Negative.   Gastrointestinal: Negative.   Genitourinary: Negative for dysuria, urgency, frequency, hematuria, flank pain, decreased urine volume, vaginal bleeding, vaginal discharge, difficulty urinating, vaginal pain and pelvic pain.  Musculoskeletal: Positive for back pain. Negative for arthralgias, myalgias, neck pain and neck stiffness.  Skin: Negative.   Allergic/Immunologic: Negative for immunocompromised state.  Neurological: Negative for dizziness, tremors, syncope, weakness, light-headedness, numbness and headaches.  Hematological: Negative for adenopathy.  Psychiatric/Behavioral: Negative.     Allergies  Codeine; Fluoxetine; and Sulfa antibiotics  Home Medications   Current Outpatient Rx  Name  Route  Sig  Dispense  Refill  . Calcium Carbonate-Vit D-Min (CALCIUM 1200 PO)   Oral   Take 1 tablet by mouth daily as needed (Dietary supplement).          . clonazePAM (KLONOPIN) 1 MG tablet   Oral   Take 1 tablet (1 mg total) by mouth at bedtime.   30 tablet  5   . famotidine (PEPCID) 10 MG tablet   Oral   Take 10 mg by mouth daily as needed for heartburn.          . meloxicam (MOBIC) 7.5 MG tablet   Oral   Take 2 tablets (15 mg total) by mouth daily.   30 tablet   0   . predniSONE (DELTASONE) 20 MG tablet   Oral   Take 2 tablets (40 mg total) by mouth daily.   10 tablet   0   . sertraline (ZOLOFT) 50 MG tablet      TAKE 1 TABLET BY MOUTH DAILY   30 tablet   5   . zolpidem (AMBIEN CR)  12.5 MG CR tablet   Oral   Take 1 tablet (12.5 mg total) by mouth at bedtime as needed for sleep.   30 tablet   2   . albuterol (PROVENTIL HFA;VENTOLIN HFA) 108 (90 BASE) MCG/ACT inhaler   Inhalation   Inhale 1-2 puffs into the lungs every 6 (six) hours as needed for wheezing or shortness of breath.   1 Inhaler   0   . oxyCODONE-acetaminophen (PERCOCET/ROXICET) 5-325 MG per tablet   Oral   Take 1-2 tablets by mouth every 6 (six) hours as needed for severe pain.   15 tablet   0   . zolpidem (AMBIEN) 10 MG tablet   Oral   Take 1 tablet (10 mg total) by mouth at bedtime as needed for sleep.   30 tablet   3    BP 140/91  Pulse 81  Temp(Src) 98 F (36.7 C) (Oral)  Resp 16  SpO2 100%  LMP 05/09/2013 Physical Exam  Nursing note and vitals reviewed. Constitutional: She is oriented to person, place, and time. She appears well-developed and well-nourished.  +appears uncomfortable  HENT:  Head: Normocephalic and atraumatic.  Eyes: Conjunctivae are normal. Right eye exhibits no discharge. Left eye exhibits no discharge.  Cardiovascular: Normal rate, regular rhythm and normal heart sounds.   Pulmonary/Chest: Effort normal and breath sounds normal. No respiratory distress.  Abdominal: Soft. Bowel sounds are normal. She exhibits no distension. There is no tenderness.  Musculoskeletal:       Lumbar back: She exhibits decreased range of motion, tenderness and pain. She exhibits no bony tenderness, no swelling, no edema, no deformity, no laceration, no spasm and normal pulse.  Pain radiates to left buttock and left posterior thigh  Neurological: She is alert and oriented to person, place, and time. She has normal strength. No cranial nerve deficit or sensory deficit. She exhibits normal muscle tone. GCS eye subscore is 4. GCS verbal subscore is 5. GCS motor subscore is 6.  +antalgic gait. Unable to participate with DTR exam secondary to pain.  Skin: Skin is warm and dry. No rash noted.   Psychiatric:  +appears upset    ED Course  Procedures (including critical care time) Labs Review Labs Reviewed - No data to display Imaging Review No results found.  EKG Interpretation    Date/Time:    Ventricular Rate:    PR Interval:    QRS Duration:   QT Interval:    QTC Calculation:   R Axis:     Text Interpretation:              MDM  It should be noted that patient had records of controlled substances having been filled under two different addresses over the last 6-12 months per the [place].  The reports were generated and I attempted to review them with the patient so that she could provide me with clarification regarding the prescriptions. She became very upset with me when I attempted to review the reports with her. Ultimately she was able to provide me with clarification. We agreed that her pain had gone beyond what could be controlled as an outpatient and I suggested that she be transferred to [facility] ER. I stated we would provide her with transportation to [facility] ER via shuttle for evaluation and possible lumbar spine MRI along with pain management. She reported she was still upset and offended by our conversation about her controlled substances report and asked to speak to my supervisor and that she was not comfortable being transferred until she speak with my supervisor. I apologized for having offended her and made Dr. Juventino Slovak aware of her wishes to speak with him.    [place], [place] 05/15/13 2137

## 2013-05-16 ENCOUNTER — Telehealth: Payer: Self-pay | Admitting: Family Medicine

## 2013-05-16 NOTE — Telephone Encounter (Signed)
Pt would like to touch base w/ you about an incident that happened at [facility]. (Sometime this week) Pt was very upset after this. Pt states she sent you an email, but wanted to ensure you received.

## 2013-05-17 ENCOUNTER — Encounter: Payer: Self-pay | Admitting: Family Medicine

## 2013-05-20 NOTE — ED Provider Notes (Signed)
Medical screening examination/treatment/procedure(s) were performed by resident physician or non-physician practitioner and as supervising physician I was immediately available for consultation/collaboration.   Barkley BrunsKINDL,JAMES DOUGLAS MD.   Linna HoffJames D Kindl, MD 05/20/13 1040

## 2013-05-26 NOTE — ED Provider Notes (Signed)
Medical screening examination/treatment/procedure(s) were performed by non-physician practitioner and as supervising physician I was immediately available for consultation/collaboration.  EKG Interpretation   None         Naziyah Tieszen, MD 05/26/13 1104 

## 2013-05-29 ENCOUNTER — Encounter: Payer: Self-pay | Admitting: Family Medicine

## 2013-05-29 ENCOUNTER — Ambulatory Visit (INDEPENDENT_AMBULATORY_CARE_PROVIDER_SITE_OTHER): Payer: 59 | Admitting: Family Medicine

## 2013-05-29 VITALS — BP 124/78 | Temp 98.3°F | Ht 71.0 in | Wt 184.0 lb

## 2013-05-29 DIAGNOSIS — R5381 Other malaise: Secondary | ICD-10-CM

## 2013-05-29 DIAGNOSIS — R5383 Other fatigue: Principal | ICD-10-CM

## 2013-05-29 NOTE — Progress Notes (Signed)
   Subjective:    Patient ID: Sandra [name], female    DOB: 07/27/1980, 33 y.o.   MRN: [id number]  HPI Here for 2 days of lightheadedness and fatigue. She denies actual vertigo, no room spinning, etc. No other specific sx. She denies sinus pressure or HA, no ST or cough, no fever. No recent medication changes. She has chronic insomnia and she thinks she is simply exhausted. 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 She is in the process of weaning her 31 year old daughter off her pacifier, and the child has been up crying all night for several nights. She has no hx of anemia and her Hgb was normal last summer. She does not have menses because she has an IUD.    Review of Systems  Constitutional: Positive for fatigue. Negative for fever.  Respiratory: Negative.   Cardiovascular: Negative.   Gastrointestinal: Negative.   Genitourinary: Negative.   Neurological: Positive for light-headedness. Negative for dizziness, tremors, seizures, syncope, facial asymmetry, speech difficulty, weakness, numbness and headaches.  Psychiatric/Behavioral: Positive for sleep disturbance. Negative for hallucinations, confusion and dysphoric mood. The patient is nervous/anxious.        Objective:   Physical Exam  Constitutional: She is oriented to person, place, and time. She appears well-developed and well-nourished. No distress.  HENT:  Right Ear: External ear normal.  Left Ear: External ear normal.  Nose: Nose normal.  Mouth/Throat: Oropharynx is clear and moist.  Eyes: Conjunctivae are normal.  Neck: No thyromegaly present.  Cardiovascular: Normal rate, regular rhythm, normal heart sounds and intact distal pulses.   Pulmonary/Chest: Effort normal and breath sounds normal.  Lymphadenopathy:    She has no cervical adenopathy.  Neurological: She is alert and oriented to person, place, and time. She has normal reflexes. No cranial nerve deficit. She exhibits normal muscle tone. Coordination normal.           Assessment & Plan:  It seems likely that she is stressed out and exhausted from lack of sleep. I advised her to see if she can get some help to watch her child so she can get some sleep. [place] so they can help her out. She declined any lab work today but if this continues she should have at least a CBC, a BMET,  and a TSH looked at.

## 2013-05-29 NOTE — Progress Notes (Signed)
Pre visit review using our clinic review tool, if applicable. No additional management support is needed unless otherwise documented below in the visit note. 

## 2013-05-31 ENCOUNTER — Other Ambulatory Visit: Payer: Self-pay

## 2013-05-31 ENCOUNTER — Encounter: Payer: Self-pay | Admitting: Family Medicine

## 2013-05-31 MED ORDER — OMEPRAZOLE 40 MG PO CPDR: 40.0000 mg | DELAYED_RELEASE_CAPSULE | Freq: Every day | ORAL | Status: AC

## 2013-06-04 ENCOUNTER — Ambulatory Visit (INDEPENDENT_AMBULATORY_CARE_PROVIDER_SITE_OTHER): Payer: Commercial Managed Care - PPO | Admitting: Surgery

## 2013-06-04 ENCOUNTER — Encounter (INDEPENDENT_AMBULATORY_CARE_PROVIDER_SITE_OTHER): Payer: Self-pay | Admitting: Surgery

## 2013-06-04 VITALS — BP 108/76 | HR 71 | Temp 97.0°F | Resp 16 | Ht 71.0 in | Wt 186.6 lb

## 2013-06-04 DIAGNOSIS — R1310 Dysphagia, unspecified: Secondary | ICD-10-CM

## 2013-06-04 DIAGNOSIS — R222 Localized swelling, mass and lump, trunk: Secondary | ICD-10-CM

## 2013-06-04 NOTE — Patient Instructions (Signed)
Burners or Stingers, Brachial Plexus Stretch Injury Burners or stingers are injuries which commonly happen in football or any other injury that stretches or injures the nerves from the neck to your arm. The nerves which come out of your neck and supply the arm and hand on both sides are called the brachial plexus. These nerves come from the spinal cord and wind down through the neck and under the collarbone and end as the nerves in your arms. They carry messages to and from the arm and hand. They allow you to move your arm and to also have feeling in your arms and hands.  SYMPTOMS   The main problem is a burning pain that starts in the neck and shoots into the arm.  There may be numbness, weakness or brief paralysis of the arm.  There may be needles and pins feelings which run from the neck and down the arm.  The type of problem depends on which nerves are damaged.  Shoulder weakness and muscle tenderness of the neck may occur from hours up to days after the injury or sometimes not at all. DIAGNOSIS   Your caregiver can usually diagnose this based on the history of injury, symptoms, and a physical examination.  Electromyography and nerve conduction tests may be helpful. These are tests to tell how well your nerves are working. (Your nerves are like the wires carrying electricity in your house).  Specialized x-rays may be done to make sure there is not an injury to the cervical spine (neck) or shoulder. HOME CARE INSTRUCTIONS   Apply ice to the shoulder and axilla (armpit) for 15-20 minutes, 03-04 times per day while awake for the first 2 days. Put the ice in a plastic bag and place a towel between the bag of ice and your skin.  Only take over-the-counter or prescription medicines for pain, discomfort, or fever as directed by your caregiver.  If you were given a splint or sling, wear them as instructed. You may remove them to shower.  An elastic bandage wrap may be useful for shoulder or  upper-arm swelling. RETURNING TO ACTIVITIES  You will not be able to return to activities in a contact sport until full strength and range of motion of the upper extremity and neck returns to normal, and EMG studies are negative. This means the injured nerves do not show continual problems. This can take more than 6 months. There should be no residual pain.  You can maintain your cardiovascular fitness by continuing to work out your lower extremities. PREVENTION   Prevention measures include strengthening exercises of the neck and shoulder muscles and protective devices for your neck before starting sports again if that was the cause.  More than half of these injuries will happen again. It is very important to do the strengthening exercises recommended.  Wear neck and shoulder pads that are well fitted to prevent re-injury. Sometimes it is impossible to tell the difference between an injury to the nerves that supply the arm and the spinal cord. If there is injury to the spinal cord your symptoms may worsen. It is important to pay attention to your condition. If there is any worsening of your condition, or if your condition does not improve, contact your caregiver or go to an emergency department. SEEK IMMEDIATE MEDICAL CARE IF:   You develop severe neck pain.  You lose control of your urine or stool.  You develop weakness in your arms or legs. MAKE SURE YOU:  Understand these instructions.  Will watch your condition.  Will get help right away if you are not doing well or get worse. Document Released: 07/17/2003 Document Revised: 07/19/2011 Document Reviewed: 12/13/2007 [facility] Patient Information 2014 [place], [place].

## 2013-06-04 NOTE — Progress Notes (Signed)
General Surgery [facility]  Chief Complaint  Patient presents with  . 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 Patient was seen earlier this month by her gynecologist who again palpated a mass in the right supraclavicular fossa. She returns today for evaluation. Previous ultrasound had shown normal lymph nodes without pathologic enlargement. Patient notes tenderness on compression. She notes radiation of pain into the shoulder and right upper arm.  PERTINENT REVIEW OF SYSTEMS: Persistent nodular mass right supraclavicular fossa, possible slow enlargement  EXAM: HEENT: normocephalic; pupils equal and reactive; sclerae clear; dentition good; mucous membranes moist NECK:  No palpable abnormality in the thyroid bed; symmetric on extension; no palpable anterior or posterior cervical lymphadenopathy; right supraclavicular mass, 1 cm, firm, mobile, with tenderness to palpation radiating towards the right shoulder and upper right arm CHEST: clear to auscultation bilaterally without rales, rhonchi, or wheezes CARDIAC: regular rate and rhythm without significant murmur; peripheral pulses are full EXT:  non-tender without edema; no deformity NEURO: no gross focal deficits; no sign of tremor   IMPRESSION: Right supraclavicular mass, 1 cm  PLAN: I discussed the case with Dr. Danae OrleansJoseph D. Venetia MaxonStern in neurosurgery. 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 Dr. Venetia MaxonStern recommended MRI of the right brachial plexus and then he will see her in the office for consultation.  Patient also relates difficulty in swallowing. She has [facility]. We will arrange for consultation with Dr. Osborn Cohoavid Shoemaker from ENT surgery in the near  future.  Velora Hecklerodd M. Jalayiah Bibian, MD, [facility] Office: [phone]  Visit Diagnoses: 1. Difficulty in swallowing   2. Supraclavicular mass

## 2013-06-06 ENCOUNTER — Telehealth (INDEPENDENT_AMBULATORY_CARE_PROVIDER_SITE_OTHER): Payer: Self-pay | Admitting: *Deleted

## 2013-06-06 NOTE — Telephone Encounter (Signed)
Left detailed message on machine regarding pts appt with Dr. Annalee GentaShoemaker on 06/27/13 @ 2:40pm.  I provided her with their phone number and address.  Instructed her to call our office with any other questions.

## 2013-06-11 ENCOUNTER — Ambulatory Visit (HOSPITAL_COMMUNITY): Admission: RE | Admit: 2013-06-11 | Payer: 59 | Source: Ambulatory Visit

## 2013-06-13 ENCOUNTER — Ambulatory Visit (HOSPITAL_COMMUNITY)
Admission: RE | Admit: 2013-06-13 | Discharge: 2013-06-13 | Disposition: A | Discharge status: Home / Self Care | Payer: 59 | Source: Ambulatory Visit | Attending: Surgery | Admitting: Surgery

## 2013-06-13 DIAGNOSIS — M25519 Pain in unspecified shoulder: Secondary | ICD-10-CM | POA: Insufficient documentation

## 2013-06-13 DIAGNOSIS — R220 Localized swelling, mass and lump, head: Secondary | ICD-10-CM | POA: Insufficient documentation

## 2013-06-13 DIAGNOSIS — R221 Localized swelling, mass and lump, neck: Principal | ICD-10-CM

## 2013-06-13 DIAGNOSIS — M79609 Pain in unspecified limb: Secondary | ICD-10-CM | POA: Insufficient documentation

## 2013-06-13 MED ORDER — GADOBENATE DIMEGLUMINE 529 MG/ML IV SOLN
17.0000 mL | Freq: Once | INTRAVENOUS | Status: AC | PRN
  Administered 2013-06-13: 17 mL via INTRAVENOUS

## 2013-06-14 ENCOUNTER — Telehealth (INDEPENDENT_AMBULATORY_CARE_PROVIDER_SITE_OTHER): Payer: Self-pay

## 2013-06-14 NOTE — Telephone Encounter (Signed)
Chest MRI available for review. Will send msg to Dr Gerrit FriendsGerkin to review and advise or call pt.

## 2013-06-15 NOTE — Telephone Encounter (Signed)
Pt has called wanting MRI results.  Please address.

## 2013-06-18 ENCOUNTER — Telehealth (INDEPENDENT_AMBULATORY_CARE_PROVIDER_SITE_OTHER): Payer: Self-pay

## 2013-06-18 NOTE — Telephone Encounter (Signed)
Per last note the result has been forwarded to Dr Gerrit FriendsGerkin to review. Awaiting his response.

## 2013-06-18 NOTE — Progress Notes (Signed)
Quick Note:  MRI shows a 12 mm lymph node adjacent to neurologic structures. No sign of tumor or other abnormality in the brachial plexus.  Please keep appointment with Dr. Venetia MaxonStern for further evaluation and recommendations.  Also keep appointment with Dr. Annalee GentaShoemaker.  Velora Hecklerodd M. Effie Wahlert, MD, [facility] Office: [phone]    ______

## 2013-06-18 NOTE — Telephone Encounter (Signed)
Result sent to pt by Dr Gerrit FriendsGerkin via my chart.

## 2013-06-18 NOTE — Telephone Encounter (Signed)
Pt calling to get her MRI results and to see what Dr Gerrit FriendsGerkin advises for her to do next. Please call.

## 2013-06-20 ENCOUNTER — Other Ambulatory Visit (INDEPENDENT_AMBULATORY_CARE_PROVIDER_SITE_OTHER): Payer: Self-pay

## 2013-06-20 DIAGNOSIS — R599 Enlarged lymph nodes, unspecified: Secondary | ICD-10-CM

## 2013-06-27 ENCOUNTER — Telehealth (INDEPENDENT_AMBULATORY_CARE_PROVIDER_SITE_OTHER): Payer: Self-pay

## 2013-06-27 NOTE — Telephone Encounter (Signed)
Pt received results of her MRI from Dr. Gerrit FriendsGerkin.  She has seen Dr. Annalee GentaShoemaker and per the patient was told that he could not help her, but she could [facility].  She was not happy with that visit, and would like to know if this nodule/lump needs to be removed.  Please advise.

## 2013-06-29 ENCOUNTER — Encounter: Payer: Self-pay | Admitting: Family Medicine

## 2013-07-01 NOTE — Telephone Encounter (Signed)
Received following message:  Sandra [name], CMA at 06/27/2013 4:07 PM     Status: Signed        Pt received results of her MRI from Dr. Gerrit FriendsGerkin. She has seen Dr. Annalee GentaShoemaker and per the patient was told that he could not help her, but she [facility]. She was not happy with that visit, and would like to know if this nodule/lump needs to be removed. Please advise.     Patient was seeing Dr. Annalee GentaShoemaker about an issue with [facility].  She was not seeing him about the nodule in her right supraclavicular fossa.  She has an appointment to see Dr. Maeola HarmanJoseph Stern in neurosurgery regarding the nodule in her supraclavicular fossa.  She should keep this appointment.  It is why we did the MRI scan.  Sandra Hecklerodd M. Nihal Marzella, MD, [facility] Office: [phone]

## 2013-07-02 NOTE — Telephone Encounter (Signed)
Followed up with the pt today to relay Dr. Ardine EngGerkin's message to her.  She will keep her appt with Dr. Venetia MaxonStern on 07/04/13.

## 2013-07-11 ENCOUNTER — Encounter (INDEPENDENT_AMBULATORY_CARE_PROVIDER_SITE_OTHER): Payer: Self-pay

## 2013-07-11 ENCOUNTER — Ambulatory Visit (INDEPENDENT_AMBULATORY_CARE_PROVIDER_SITE_OTHER): Payer: 59 | Admitting: Licensed Clinical Social Worker

## 2013-07-11 DIAGNOSIS — F411 Generalized anxiety disorder: Secondary | ICD-10-CM

## 2013-07-11 DIAGNOSIS — F331 Major depressive disorder, recurrent, moderate: Secondary | ICD-10-CM

## 2013-07-19 ENCOUNTER — Emergency Department (HOSPITAL_BASED_OUTPATIENT_CLINIC_OR_DEPARTMENT_OTHER)
Admission: EM | Admit: 2013-07-19 | Discharge: 2013-07-19 | Disposition: A | Discharge status: Home / Self Care | Payer: 59 | Attending: Emergency Medicine | Admitting: Emergency Medicine

## 2013-07-19 ENCOUNTER — Encounter (HOSPITAL_BASED_OUTPATIENT_CLINIC_OR_DEPARTMENT_OTHER): Payer: Self-pay | Admitting: Emergency Medicine

## 2013-07-19 ENCOUNTER — Ambulatory Visit: Payer: 59 | Admitting: Licensed Clinical Social Worker

## 2013-07-19 DIAGNOSIS — Z79899 Other long term (current) drug therapy: Secondary | ICD-10-CM | POA: Insufficient documentation

## 2013-07-19 DIAGNOSIS — Z3202 Encounter for pregnancy test, result negative: Secondary | ICD-10-CM | POA: Insufficient documentation

## 2013-07-19 DIAGNOSIS — I10 Essential (primary) hypertension: Secondary | ICD-10-CM | POA: Insufficient documentation

## 2013-07-19 DIAGNOSIS — R112 Nausea with vomiting, unspecified: Secondary | ICD-10-CM | POA: Insufficient documentation

## 2013-07-19 DIAGNOSIS — R509 Fever, unspecified: Secondary | ICD-10-CM | POA: Insufficient documentation

## 2013-07-19 DIAGNOSIS — R52 Pain, unspecified: Secondary | ICD-10-CM | POA: Insufficient documentation

## 2013-07-19 DIAGNOSIS — K219 Gastro-esophageal reflux disease without esophagitis: Secondary | ICD-10-CM | POA: Insufficient documentation

## 2013-07-19 LAB — URINALYSIS, ROUTINE W REFLEX MICROSCOPIC
GLUCOSE, UA: NEGATIVE mg/dL
HGB URINE DIPSTICK: NEGATIVE
LEUKOCYTES UA: NEGATIVE
Nitrite: NEGATIVE
PH: 5.5 (ref 5.0–8.0)
Protein, ur: NEGATIVE mg/dL
Specific Gravity, Urine: 1.024 (ref 1.005–1.030)
Urobilinogen, UA: 0.2 mg/dL (ref 0.0–1.0)

## 2013-07-19 LAB — PREGNANCY, URINE: Preg Test, Ur: NEGATIVE

## 2013-07-19 MED ORDER — ONDANSETRON HCL 4 MG/2ML IJ SOLN
4.0000 mg | Freq: Once | INTRAMUSCULAR | Status: AC
  Administered 2013-07-19: 4 mg via INTRAVENOUS
  Filled 2013-07-19: qty 2

## 2013-07-19 MED ORDER — KETOROLAC TROMETHAMINE 15 MG/ML IJ SOLN
15.0000 mg | Freq: Once | INTRAMUSCULAR | Status: AC
  Administered 2013-07-19: 15 mg via INTRAVENOUS
  Filled 2013-07-19: qty 1

## 2013-07-19 MED ORDER — SODIUM CHLORIDE 0.9 % IV BOLUS (SEPSIS)
1000.0000 mL | Freq: Once | INTRAVENOUS | Status: AC
  Administered 2013-07-19: 1000 mL via INTRAVENOUS

## 2013-07-19 MED ORDER — ACETAMINOPHEN 325 MG PO TABS
650.0000 mg | ORAL_TABLET | Freq: Once | ORAL | Status: DC
  Filled 2013-07-19: qty 2

## 2013-07-19 MED ORDER — ONDANSETRON HCL 4 MG PO TABS: 4.0000 mg | ORAL_TABLET | Freq: Four times a day (QID) | ORAL | Status: DC

## 2013-07-19 MED ORDER — ACETAMINOPHEN 325 MG PO TABS
650.0000 mg | ORAL_TABLET | Freq: Once | ORAL | Status: AC
  Administered 2013-07-19: 650 mg via ORAL
  Filled 2013-07-19: qty 2

## 2013-07-19 NOTE — Discharge Instructions (Signed)
Viral Gastroenteritis Viral gastroenteritis is also known as stomach flu. This condition affects the stomach and intestinal tract. It can cause sudden diarrhea and vomiting. The illness typically lasts 3 to 8 days. Most people develop an immune response that eventually gets rid of the virus. While this natural response develops, the virus can make you quite ill. CAUSES  Many different viruses can cause gastroenteritis, such as rotavirus or noroviruses. You can catch one of these viruses by consuming contaminated food or water. You may also catch a virus by sharing utensils or other personal items with an infected person or by touching a contaminated surface. SYMPTOMS  The most common symptoms are diarrhea and vomiting. These problems can cause a severe loss of body fluids (dehydration) and a body salt (electrolyte) imbalance. Other symptoms may include:  Fever.  Headache.  Fatigue.  Abdominal pain. DIAGNOSIS  Your caregiver can usually diagnose viral gastroenteritis based on your symptoms and a physical exam. A stool sample may also be taken to test for the presence of viruses or other infections. TREATMENT  This illness typically goes away on its own. Treatments are aimed at rehydration. The most serious cases of viral gastroenteritis involve vomiting so severely that you are not able to keep fluids down. In these cases, fluids must be given through an intravenous line (IV). HOME CARE INSTRUCTIONS   Drink enough fluids to keep your urine clear or pale yellow. Drink small amounts of fluids frequently and increase the amounts as tolerated.  Ask your caregiver for specific rehydration instructions.  Avoid:  Foods high in sugar.  Alcohol.  Carbonated drinks.  Tobacco.  Juice.  Caffeine drinks.  Extremely hot or cold fluids.  Fatty, greasy foods.  Too much intake of anything at one time.  Dairy products until 24 to 48 hours after diarrhea stops.  You may consume probiotics.  Probiotics are active cultures of beneficial bacteria. They may lessen the amount and number of diarrheal stools in adults. Probiotics can be found in yogurt with active cultures and in supplements.  Wash your hands well to avoid spreading the virus.  Only take over-the-counter or prescription medicines for pain, discomfort, or fever as directed by your caregiver. Do not give aspirin to children. Antidiarrheal medicines are not recommended.  Ask your caregiver if you should continue to take your regular prescribed and over-the-counter medicines.  Keep all follow-up appointments as directed by your caregiver. SEEK IMMEDIATE MEDICAL CARE IF:   You are unable to keep fluids down.  You do not urinate at least once every 6 to 8 hours.  You develop shortness of breath.  You notice blood in your stool or vomit. This may look like coffee grounds.  You have abdominal pain that increases or is concentrated in one small area (localized).  You have persistent vomiting or diarrhea.  You have a fever.  The patient is a child younger than 3 months, and he or she has a fever.  The patient is a child older than 3 months, and he or she has a fever and persistent symptoms.  The patient is a child older than 3 months, and he or she has a fever and symptoms suddenly get worse.  The patient is a baby, and he or she has no tears when crying. MAKE SURE YOU:   Understand these instructions.  Will watch your condition.  Will get help right away if you are not doing well or get worse. Document Released: 04/26/2005 Document Revised: 07/19/2011 Document Reviewed: 02/10/2011   [name] Patient Information 2014 [name].  

## 2013-07-19 NOTE — ED Provider Notes (Signed)
CSN: [id number]     Arrival date & time 07/19/13  1327 History   First MD Initiated Contact with Patient 07/19/13 1429     Chief Complaint  Patient presents with  . Fever  . Emesis     (Consider location/radiation/quality/duration/timing/severity/associated sxs/prior Treatment) Patient is a 33 y.o. female presenting with fever and vomiting. The history is provided by the patient. No language interpreter was used.  Fever Temp source:  Subjective Severity:  Severe Onset quality:  Sudden Duration:  14 hours Timing:  Constant Progression:  Improving Chronicity:  New Relieved by:  Nothing Exacerbated by: drinking. Ineffective treatments:  None tried Associated symptoms: chills, myalgias, nausea and vomiting   Associated symptoms: no chest pain, no confusion, no congestion, no cough, no diarrhea, no dysuria, no headaches, no rash, no rhinorrhea and no sore throat   Associated symptoms comment:  Nausea, vomiting Myalgias:    Location:  Generalized   Quality:  Aching   Severity:  Moderate   Duration:  14 hours   Timing:  Intermittent   Progression:  Waxing and waning Nausea:    Severity:  Severe   Onset quality:  Sudden   Duration:  14 hours   Timing:  Constant   Progression:  Unchanged Vomiting:    Quality:  Stomach contents   Number of occurrences:  15-20   Severity:  Severe   Duration:  12 hours (None for last 2 hours)   Timing:  Constant   Progression:  Improving Risk factors: sick contacts (daughter with similar illness)   Emesis Associated symptoms: chills and myalgias   Associated symptoms: no abdominal pain, no arthralgias, no diarrhea, no headaches and no sore throat     Past Medical History  Diagnosis Date  . GERD (gastroesophageal reflux disease)   . SROM (spontaneous rupture of membranes) 01/31/2011  . Spontaneous vaginal delivery 02/01/2011  . Hypertension    Past Surgical History  Procedure Laterality Date  . Tonsillectomy     Family History   Problem Relation Age of Onset  . Hypertension Father   . Diabetes type II Father     Mild diabetes/prediabetes  . Breast cancer Paternal Aunt   . Cancer Paternal Aunt     cervical OR ovarian  . Multiple myeloma Maternal Grandmother   . Cancer Maternal Grandmother     multiple myeloma  . Cancer Paternal Grandfather     prostate   History  Substance Use Topics  . Smoking status: Never Smoker   . Smokeless tobacco: Never Used  . Alcohol Use: No   OB History   Grav Para Term Preterm Abortions TAB SAB Ect Mult Living   1 1 1       1      Review of Systems  Constitutional: Positive for fever and chills. Negative for diaphoresis, activity change, appetite change and fatigue.  HENT: Negative for congestion, facial swelling, rhinorrhea and sore throat.   Eyes: Negative for photophobia and discharge.  Respiratory: Negative for cough, chest tightness and shortness of breath.   Cardiovascular: Negative for chest pain, palpitations and leg swelling.  Gastrointestinal: Positive for nausea and vomiting. Negative for abdominal pain and diarrhea.  Endocrine: Negative for polydipsia and polyuria.  Genitourinary: Negative for dysuria, frequency, difficulty urinating and pelvic pain.  Musculoskeletal: Positive for myalgias. Negative for arthralgias, back pain, neck pain and neck stiffness.  Skin: Negative for color change, rash and wound.  Allergic/Immunologic: Negative for immunocompromised state.  Neurological: Negative for facial asymmetry, weakness, numbness and  headaches.  Hematological: Does not bruise/bleed easily.  Psychiatric/Behavioral: Negative for confusion and agitation.      Allergies  Codeine; Adhesive; Fluoxetine; and Sulfa antibiotics  Home Medications   Current Outpatient Rx  Name  Route  Sig  Dispense  Refill  . albuterol (PROVENTIL HFA;VENTOLIN HFA) 108 (90 BASE) MCG/ACT inhaler   Inhalation   Inhale 1-2 puffs into the lungs every 6 (six) hours as needed for  wheezing or shortness of breath.   1 Inhaler   0   . Calcium Carbonate-Vit D-Min (CALCIUM 1200 PO)   Oral   Take 1 tablet by mouth daily as needed (Dietary supplement).          . clonazePAM (KLONOPIN) 1 MG tablet   Oral   Take 1 tablet (1 mg total) by mouth at bedtime.   30 tablet   5   . famotidine (PEPCID) 10 MG tablet   Oral   Take 10 mg by mouth daily as needed for heartburn.          Marland Kitchen omeprazole (PRILOSEC) 40 MG capsule   Oral   Take 1 capsule (40 mg total) by mouth daily.   90 capsule   3   . sertraline (ZOLOFT) 50 MG tablet      TAKE 1 TABLET BY MOUTH DAILY   30 tablet   5   . zolpidem (AMBIEN CR) 12.5 MG CR tablet      TAKE 1 TABLET BY MOUTH AT BEDTIME AS NEEDED FOR SLEEP   30 tablet   2   . zolpidem (AMBIEN) 10 MG tablet   Oral   Take 1 tablet (10 mg total) by mouth at bedtime as needed for sleep.   30 tablet   3    BP 118/87  Pulse 64  Temp(Src) 99.9 F (37.7 C) (Oral)  Resp 20  Ht 5' 11"  (1.803 m)  Wt 180 lb (81.647 kg)  BMI 25.12 kg/m2  SpO2 100% Physical Exam  Constitutional: She is oriented to person, place, and time. She appears well-developed and well-nourished. She appears listless. No distress.  HENT:  Head: Normocephalic and atraumatic.  Mouth/Throat: No oropharyngeal exudate.  Eyes: Pupils are equal, round, and reactive to light.  Neck: Normal range of motion. Neck supple.  Cardiovascular: Normal rate, regular rhythm and normal heart sounds.  Exam reveals no gallop and no friction rub.   No murmur heard. Pulmonary/Chest: Effort normal and breath sounds normal. No respiratory distress. She has no wheezes. She has no rales.  Abdominal: Soft. Bowel sounds are normal. She exhibits no distension and no mass. There is no tenderness. There is no rebound and no guarding.  Musculoskeletal: Normal range of motion. She exhibits no edema and no tenderness.  Neurological: She is oriented to person, place, and time. She appears listless.   Skin: Skin is warm and dry.  Psychiatric: She has a normal mood and affect.    ED Course  Procedures (including critical care time) Labs Review Labs Reviewed  URINALYSIS, ROUTINE W REFLEX MICROSCOPIC - Abnormal; Notable for the following:    Bilirubin Urine SMALL (*)    Ketones, ur >80 (*)    All other components within normal limits  PREGNANCY, URINE   Imaging Review No results found.   EKG Interpretation None      MDM   Final diagnoses:  Nausea & vomiting  Fever    Pt is a 33 y.o. female with Pmhx as above who presents with sudden onset n/a, subjective  fever/chills for about 14 hours. Denies ab pain, d/a, GU symptoms. She states her daughter had a similar illness recently. On PE, she has low grade temp, VS otherwise stable.  Mucus membranes moist.  Cardiopulm & abdominal exam benign. Pt given IVF, IV zofran, toradol with symptoms much improved.  Suspect acute viral illness and feel she is safe for continued supportive care at home with PO hydration, zofran for n/v. Return precautions given for new or worsening symptoms including inability to tolerate PO, ab pain.          Neta Ehlers, MD 07/20/13 2021

## 2013-07-19 NOTE — ED Notes (Signed)
Pt given sprite for fluid challenge. Pt reports relief of nausea. Pt states that her body is still aching, md aware. Pt denies any other c/o or needs.

## 2013-07-19 NOTE — ED Notes (Signed)
Fever, aching all over. She has been vomiting for the past 12 hours.

## 2013-07-31 ENCOUNTER — Ambulatory Visit (INDEPENDENT_AMBULATORY_CARE_PROVIDER_SITE_OTHER): Payer: 59 | Admitting: Licensed Clinical Social Worker

## 2013-07-31 DIAGNOSIS — F411 Generalized anxiety disorder: Secondary | ICD-10-CM

## 2013-07-31 DIAGNOSIS — F331 Major depressive disorder, recurrent, moderate: Secondary | ICD-10-CM

## 2013-08-16 ENCOUNTER — Encounter: Payer: Self-pay | Admitting: Family Medicine

## 2013-08-20 ENCOUNTER — Encounter: Payer: Self-pay | Admitting: Family Medicine

## 2013-08-30 ENCOUNTER — Encounter: Payer: Self-pay | Admitting: Family Medicine

## 2013-08-30 ENCOUNTER — Ambulatory Visit (INDEPENDENT_AMBULATORY_CARE_PROVIDER_SITE_OTHER): Payer: 59 | Admitting: Family Medicine

## 2013-08-30 VITALS — BP 128/90 | HR 91 | Wt 186.0 lb

## 2013-08-30 DIAGNOSIS — F5104 Psychophysiologic insomnia: Secondary | ICD-10-CM

## 2013-08-30 DIAGNOSIS — G4700 Insomnia, unspecified: Secondary | ICD-10-CM

## 2013-08-30 NOTE — Progress Notes (Signed)
Subjective:    Patient ID: Sandra [name], female    DOB: [birth year], 33 y.o.   MRN: [id number]  HPI Patient here to discuss insomnia issues. She has had insomnia for many years and especially had difficulties over the past couple of years with multiple stressors. She is a single parent and has recently had some custody dispute issues with her ex-boyfriend. She is preparing to make a change in her job location. She continues to work full time.  She's had chronic difficulties falling asleep. She has tried multiple things over-the-counter including melatonin which did not help. She tried NyQuil and Unisom and had some side effects and hangover drowsiness the next day. She has previously tried trazodone which did not work well. She has weaned herself off of clonazepam and has tried to reduce her medications tremendously. She is currently only taking one half of an Ambien 10 mg. She's also tapered off the Zoloft. We treated her for depression within the past year and she feels her depressive symptoms have improved.  She is diligent in avoiding caffeine late in the day. No alcohol use. She has never misused medications in the past. She is well aware of sleep hygiene issues and tries to follow these closely. No decongestant or other stimulant use.  Past Medical History  Diagnosis Date  . GERD (gastroesophageal reflux disease)   . SROM (spontaneous rupture of membranes) 01/31/2011  . Spontaneous vaginal delivery 02/01/2011  . Hypertension    Past Surgical History  Procedure Laterality Date  . Tonsillectomy      reports that she has never smoked. She has never used smokeless tobacco. She reports that she does not drink alcohol or use illicit drugs. family history includes Breast cancer in her paternal aunt; Cancer in her maternal grandmother, paternal aunt, and paternal grandfather; Diabetes type II in her father; Hypertension in her father; Multiple myeloma in her maternal grandmother. Allergies   Allergen Reactions  . Codeine   . Adhesive [Tape] Rash  . Fluoxetine     Headache   . Sulfa Antibiotics Rash      Review of Systems  Constitutional: Negative for appetite change and unexpected weight change.  Respiratory: Negative for cough and shortness of breath.   Neurological: Negative for dizziness and headaches.  Psychiatric/Behavioral: Positive for sleep disturbance. Negative for confusion and dysphoric mood.       Objective:   Physical Exam  Constitutional: She is oriented to person, place, and time. She appears well-developed and well-nourished.  Cardiovascular: Normal rate.   No murmur heard. Pulmonary/Chest: Effort normal and breath sounds normal. No respiratory distress. She has no wheezes. She has no rales.  Neurological: She is alert and oriented to person, place, and time. No cranial nerve deficit.  Psychiatric: She has a normal mood and affect. Her behavior is normal. Judgment and thought content normal.          Assessment & Plan:  #1 chronic insomnia. Long discussion regarding options. She has not done well with over-the-counter medications either because of lack of efficacy or side effects. She has done excellent job tapering off of sertraline and clonazepam and she does not have any current active depression issues. We again reiterated sleep hygiene and handout given. Increase exercise frequency. We did discuss other possible options such as Rozerem but since she has not responded well to over-the-counter melatonin she may not respond to this either. She'll continue with low-dose Ambien 10 mg one half tablet each bedtime as needed  We had a long discussion regarding the balance of risk of medication side effects versus adverse effects of not getting any sleep and feel that she clearly weighs in favor of remaining on low-dose Ambien at this time as needed.  #2 history of depression. Currently stable off of sertraline. She's had recent counseling which she  states did not help much.

## 2013-08-30 NOTE — Progress Notes (Signed)
Pre visit review using our clinic review tool, if applicable. No additional management support is needed unless otherwise documented below in the visit note. 

## 2013-08-30 NOTE — Patient Instructions (Signed)

## 2013-09-23 ENCOUNTER — Encounter: Payer: Self-pay | Admitting: Family Medicine

## 2013-09-25 ENCOUNTER — Other Ambulatory Visit: Payer: Self-pay

## 2013-09-25 MED ORDER — ZOLPIDEM TARTRATE 10 MG PO TABS: 10.0000 mg | ORAL_TABLET | Freq: Every evening | ORAL | Status: AC | PRN

## 2013-09-27 ENCOUNTER — Other Ambulatory Visit: Payer: Self-pay | Admitting: Family Medicine

## 2013-10-02 ENCOUNTER — Ambulatory Visit (INDEPENDENT_AMBULATORY_CARE_PROVIDER_SITE_OTHER): Payer: 59 | Admitting: Physician Assistant

## 2013-10-02 ENCOUNTER — Other Ambulatory Visit (HOSPITAL_COMMUNITY)
Admission: RE | Admit: 2013-10-02 | Discharge: 2013-10-02 | Disposition: A | Discharge status: Home / Self Care | Payer: 59 | Source: Ambulatory Visit | Attending: Physician Assistant | Admitting: Physician Assistant

## 2013-10-02 ENCOUNTER — Encounter: Payer: Self-pay | Admitting: Physician Assistant

## 2013-10-02 VITALS — BP 120/80 | HR 78 | Temp 97.9°F | Resp 16 | Wt 186.0 lb

## 2013-10-02 DIAGNOSIS — Z113 Encounter for screening for infections with a predominantly sexual mode of transmission: Secondary | ICD-10-CM | POA: Insufficient documentation

## 2013-10-02 DIAGNOSIS — N760 Acute vaginitis: Secondary | ICD-10-CM | POA: Insufficient documentation

## 2013-10-02 NOTE — Patient Instructions (Signed)
We will call you with your Lab results when they are available.  Follow up as needed.   Sexually Transmitted Disease A sexually transmitted disease (STD) is a disease or infection that may be passed (transmitted) from person to person, usually during sexual activity. This may happen by way of saliva, semen, blood, vaginal mucus, or urine. Common STDs include:   Gonorrhea.   Chlamydia.   Syphilis.   HIV and AIDS.   Genital herpes.   Hepatitis B and C.   Trichomonas.   Human papillomavirus (HPV).   Pubic lice.   Scabies.  Mites.  Bacterial vaginosis. WHAT ARE CAUSES OF STDs? An STD may be caused by bacteria, a virus, or parasites. STDs are often transmitted during sexual activity if one person is infected. However, they may also be transmitted through nonsexual means. STDs may be transmitted after:   Sexual intercourse with an infected person.   Sharing sex toys with an infected person.   Sharing needles with an infected person or using unclean piercing or tattoo needles.  Having intimate contact with the genitals, mouth, or rectal areas of an infected person.   Exposure to infected fluids during birth. WHAT ARE THE SIGNS AND SYMPTOMS OF STDs? Different STDs have different symptoms. Some people may not have any symptoms. If symptoms are present, they may include:   Painful or bloody urination.   Pain in the pelvis, abdomen, vagina, anus, throat, or eyes.   Skin rash, itching, irritation, growths, sores (lesions), ulcerations, or warts in the genital or anal area.  Abnormal vaginal discharge with or without bad odor.   Penile discharge in men.   Fever.   Pain or bleeding during sexual intercourse.   Swollen glands in the groin area.   Yellow skin and eyes (jaundice). This is seen with hepatitis.   Swollen testicles.  Infertility.  Sores and blisters in the mouth. HOW ARE STDs DIAGNOSED? To make a diagnosis, your health care  provider may:   Take a medical history.   Perform a physical exam.   Take a sample of any discharge for examination.  Swab the throat, cervix, opening to the penis, rectum, or vagina for testing.  Test a sample of your first morning urine.   Perform blood tests.   Perform a Pap smear, if this applies.   Perform a colposcopy.   Perform a laparoscopy.  HOW ARE STDs TREATED? Treatment depends on the STD. Some STDs may be treated but not cured.   Chlamydia, gonorrhea, trichomonas, and syphilis can be cured with antibiotics.   Genital herpes, hepatitis, and HIV can be treated, but not cured, with prescribed medicines. The medicines lessen symptoms.   Genital warts from HPV can be treated with medicine or by freezing, burning (electrocautery), or surgery. Warts may come back.   HPV cannot be cured with medicine or surgery. However, abnormal areas may be removed from the cervix, vagina, or vulva.   If your diagnosis is confirmed, your recent sexual partners need treatment. This is true even if they are symptom-free or have a negative culture or evaluation. They should not have sex until their health care providers say it is OK. HOW CAN I REDUCE MY RISK OF GETTING AN STD?  Use latex condoms, dental dams, and water-soluble lubricants during sexual activity. Do not use petroleum jelly or oils.  Get vaccinated for HPV and hepatitis. If you have not received these vaccines in the past, talk to your health care provider about whether one or both  might be right for you.   Avoid risky sex practices that can break the skin.  WHAT SHOULD I DO IF I THINK I HAVE AN STD?  See your health care provider.   Inform all sexual partners. They should be tested and treated for any STDs.  Do not have sex until your health care provider says it is OK. WHEN SHOULD I GET HELP? Seek immediate medical care if:  You develop severe abdominal pain.  You are a man and notice swelling or  pain in the testicles.  You are a woman and notice swelling or pain in your vagina. Document Released: 07/17/2002 Document Revised: 02/14/2013 Document Reviewed: 11/14/2012 [facility] Patient Information 2014 [place], [name].

## 2013-10-02 NOTE — Progress Notes (Signed)
   Subjective:    Patient ID: Sandra [name], female    DOB: 02/10/1981, 33 y.o.   MRN: [id number]  HPI Patient is a 33 year old healthy Caucasian female presenting to the clinic requesting STD screening. She is not experiencing any vaginal or urinary symptoms. No known STD exposure.   She is concerned because she is currently in a custody battle with her Ex-significant (the father of her child) and is unsure if he was completely faithful during the course of their relationship together.   She denies fevers, chills, nausea, vomiting, diarrhea, and shortness of breath.    Review of Systems As per the history of present illness and otherwise negative.  Past Medical History  Diagnosis Date  . GERD (gastroesophageal reflux disease)   . SROM (spontaneous rupture of membranes) 01/31/2011  . Spontaneous vaginal delivery 02/01/2011  . Hypertension    Past Surgical History  Procedure Laterality Date  . Tonsillectomy      reports that she has never smoked. She has never used smokeless tobacco. She reports that she does not drink alcohol or use illicit drugs. family history includes Breast cancer in her paternal aunt; Cancer in her maternal grandmother, paternal aunt, and paternal grandfather; Diabetes type II in her father; Hypertension in her father; Multiple myeloma in her maternal grandmother. Allergies  Allergen Reactions  . Codeine   . Adhesive [Tape] Rash  . Fluoxetine     Headache   . Sulfa Antibiotics Rash       Objective:   Physical Exam  Nursing note and vitals reviewed. Constitutional: She is oriented to person, place, and time. She appears well-developed and well-nourished. No distress.  HENT:  Head: Normocephalic and atraumatic.  Eyes: Conjunctivae and EOM are normal. Pupils are equal, round, and reactive to light.  Neck: Normal range of motion. Neck supple.  Cardiovascular: Normal rate, regular rhythm, normal heart sounds and intact distal pulses.  Exam reveals no  gallop and no friction rub.   No murmur heard. Pulmonary/Chest: Effort normal and breath sounds normal. No respiratory distress. She has no wheezes. She has no rales. She exhibits no tenderness.  Musculoskeletal: Normal range of motion.  Neurological: She is alert and oriented to person, place, and time.  Skin: Skin is warm and dry. No rash noted. She is not diaphoretic. No erythema. No pallor.  Psychiatric: She has a normal mood and affect. Her behavior is normal. Judgment and thought content normal.   Filed Vitals:   10/02/13 1539  BP: 120/80  Pulse: 78  Temp: 97.9 F (36.6 C)  Resp: 16    Lab Results  Component Value Date   WBC 5.9 10/11/2012   HGB 13.8 10/11/2012   HCT 41.4 10/11/2012   PLT 219.0 10/11/2012        Assessment & Plan:  Sandra [name] was seen today for exposure to std.  Diagnoses and associated orders for this visit:  Screen for STD (sexually transmitted disease) - STD Panel (HBSAG,HIV,RPR) - Urine cytology ancillary only   Will call pt with lab results and additional plan if indicated.  Plan to follow up as needed.  Patient Instructions  We will call you with your Lab results when they are available.  Follow up as needed.

## 2013-10-02 NOTE — Progress Notes (Signed)
Pre visit review using our clinic review tool, if applicable. No additional management support is needed unless otherwise documented below in the visit note. 

## 2013-10-03 LAB — STD PANEL
HIV: NONREACTIVE
Hepatitis B Surface Ag: NEGATIVE

## 2013-10-04 ENCOUNTER — Telehealth: Payer: Self-pay | Admitting: Family Medicine

## 2013-10-04 NOTE — Telephone Encounter (Signed)
Pt was like results of labs done yesterday. Pt saw mat. pls call!

## 2013-10-09 ENCOUNTER — Telehealth: Payer: Self-pay | Admitting: Physician Assistant

## 2013-10-09 NOTE — Telephone Encounter (Signed)
Called pt to delivery urine cytology results. All negative except for G. Vaginal and P. Bivia. Explained to pt that we don't treat these in asymptomatic pts as they can represent normal flora. She will monitor for the development of symptoms, but understands that she may not ever experience symptoms from this. Follow up as needed.

## 2014-01-17 IMAGING — CR DG CHEST 2V
3 series · 3 of 3 positions shown · non-contrast
Comparison: 07/07/2009

CLINICAL DATA: Cough and fever for 4 days

CHEST - 2 VIEW

[view not recorded (1 of 3)]
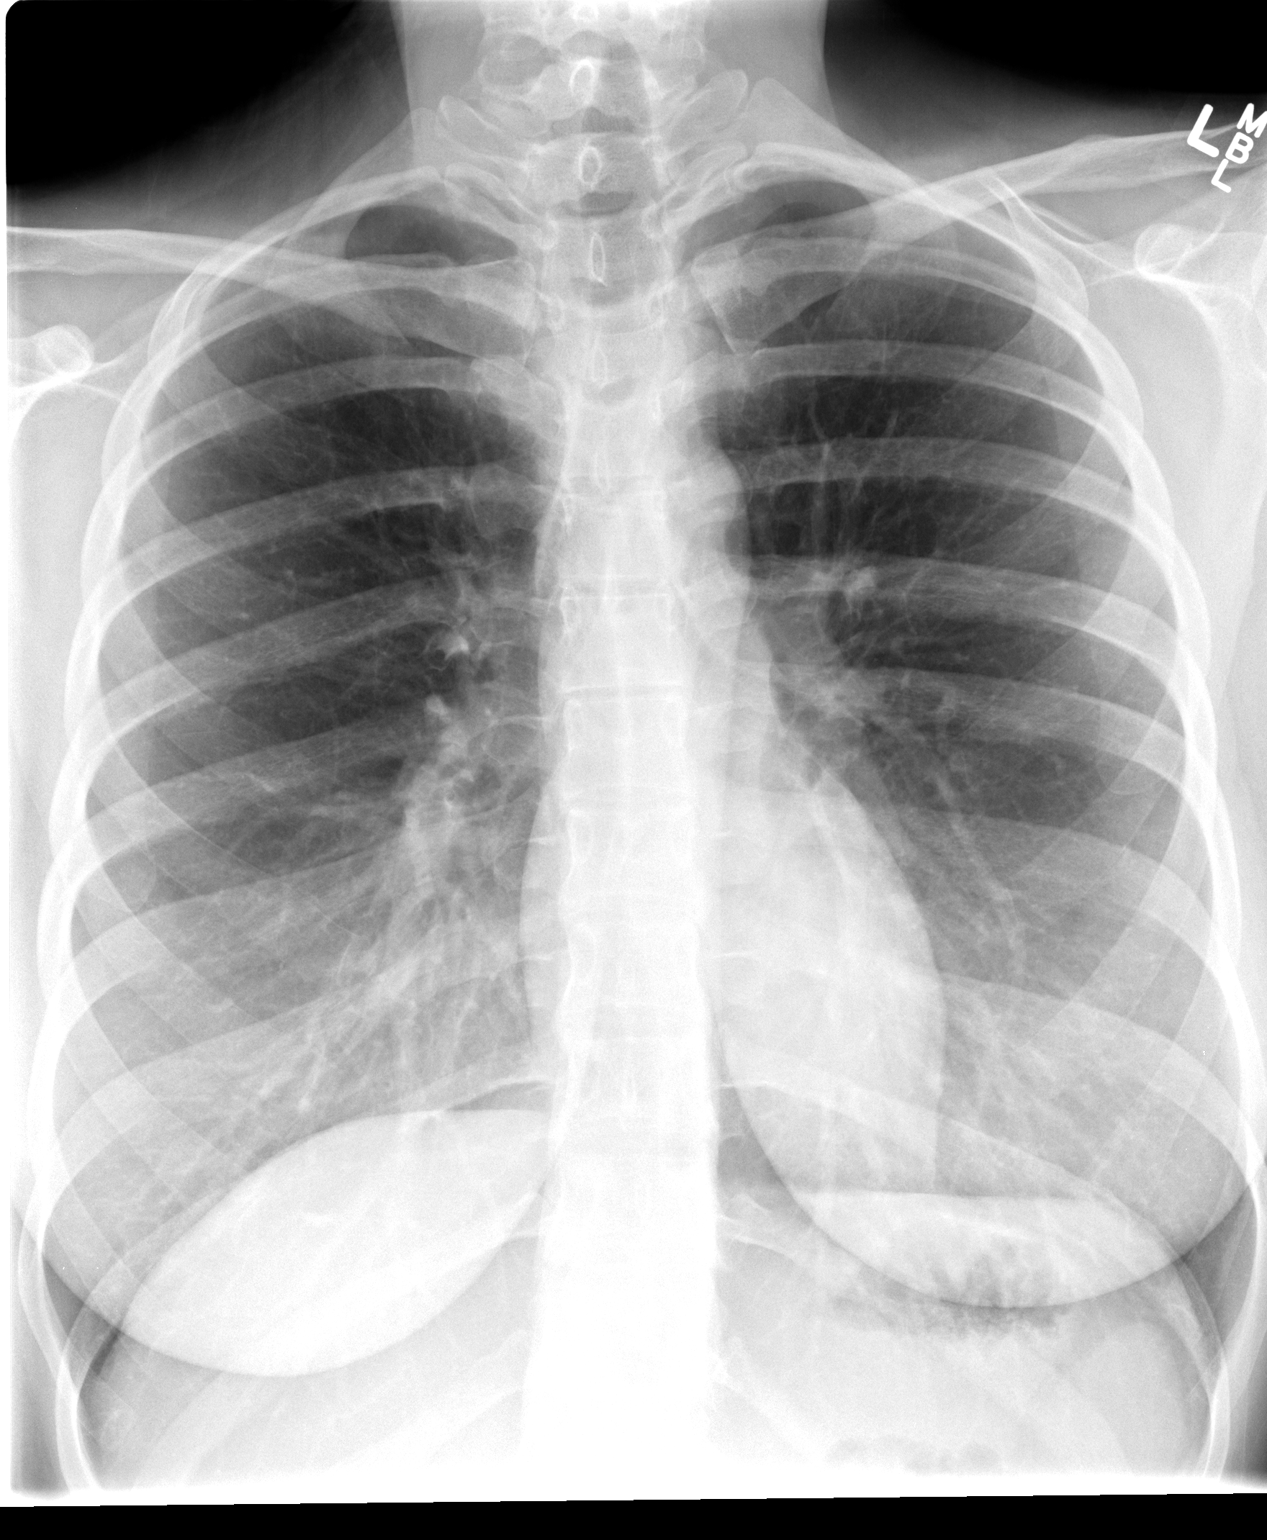

[view not recorded (2 of 3)]
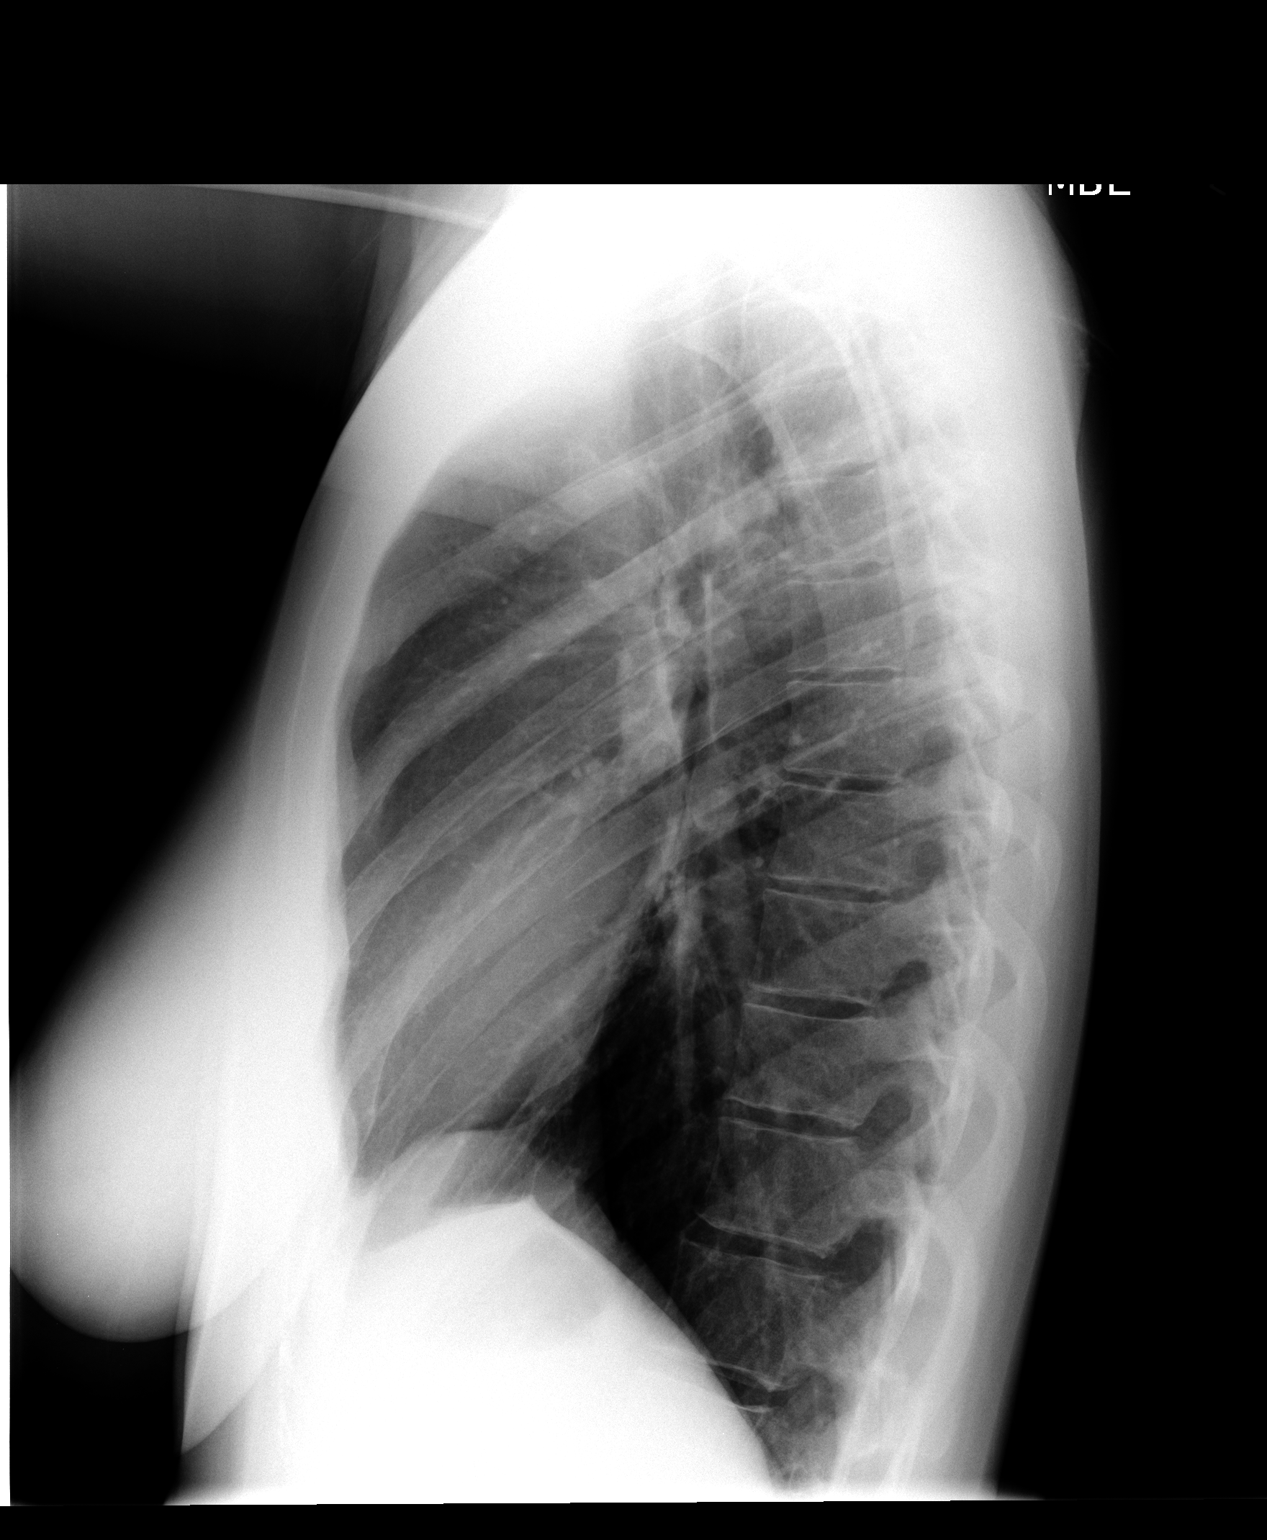

[view not recorded (3 of 3)]
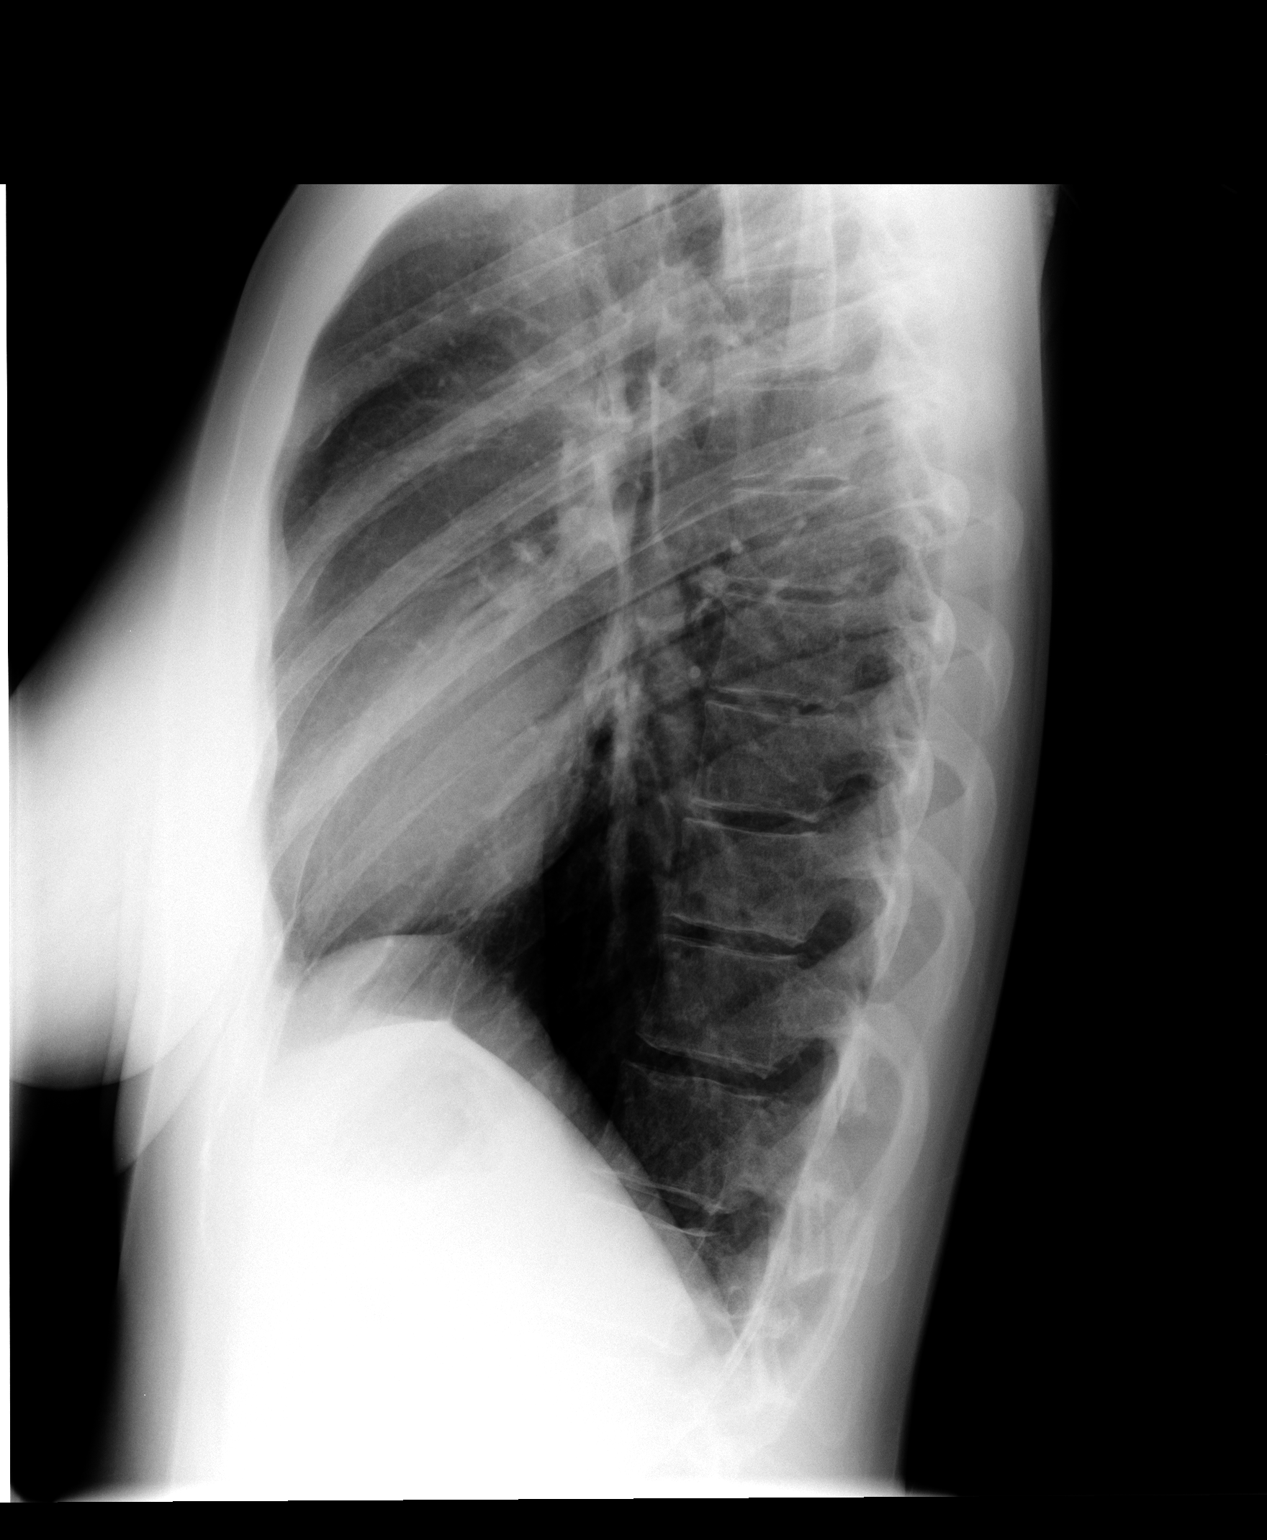

[3 of 3 positions shown; findings below may reference images not displayed]

FINDINGS: The heart and mediastinal contours are within normal
limits. The lungs are clear. No airspace disease, pleural effusion,
or pneumothorax is identified. The visualized bony thorax is
unremarkable.
IMPRESSION: No acute cardiopulmonary disease.

## 2014-03-11 ENCOUNTER — Encounter: Payer: Self-pay | Admitting: Physician Assistant

## 2014-08-15 IMAGING — CR DG CHEST 2V
3 series · 3 of 3 positions shown · non-contrast
Comparison: 05/27/2012.

CLINICAL DATA: 31-year-old female with nonproductive cough.

CHEST - 2 VIEW

[view not recorded (1 of 3)]
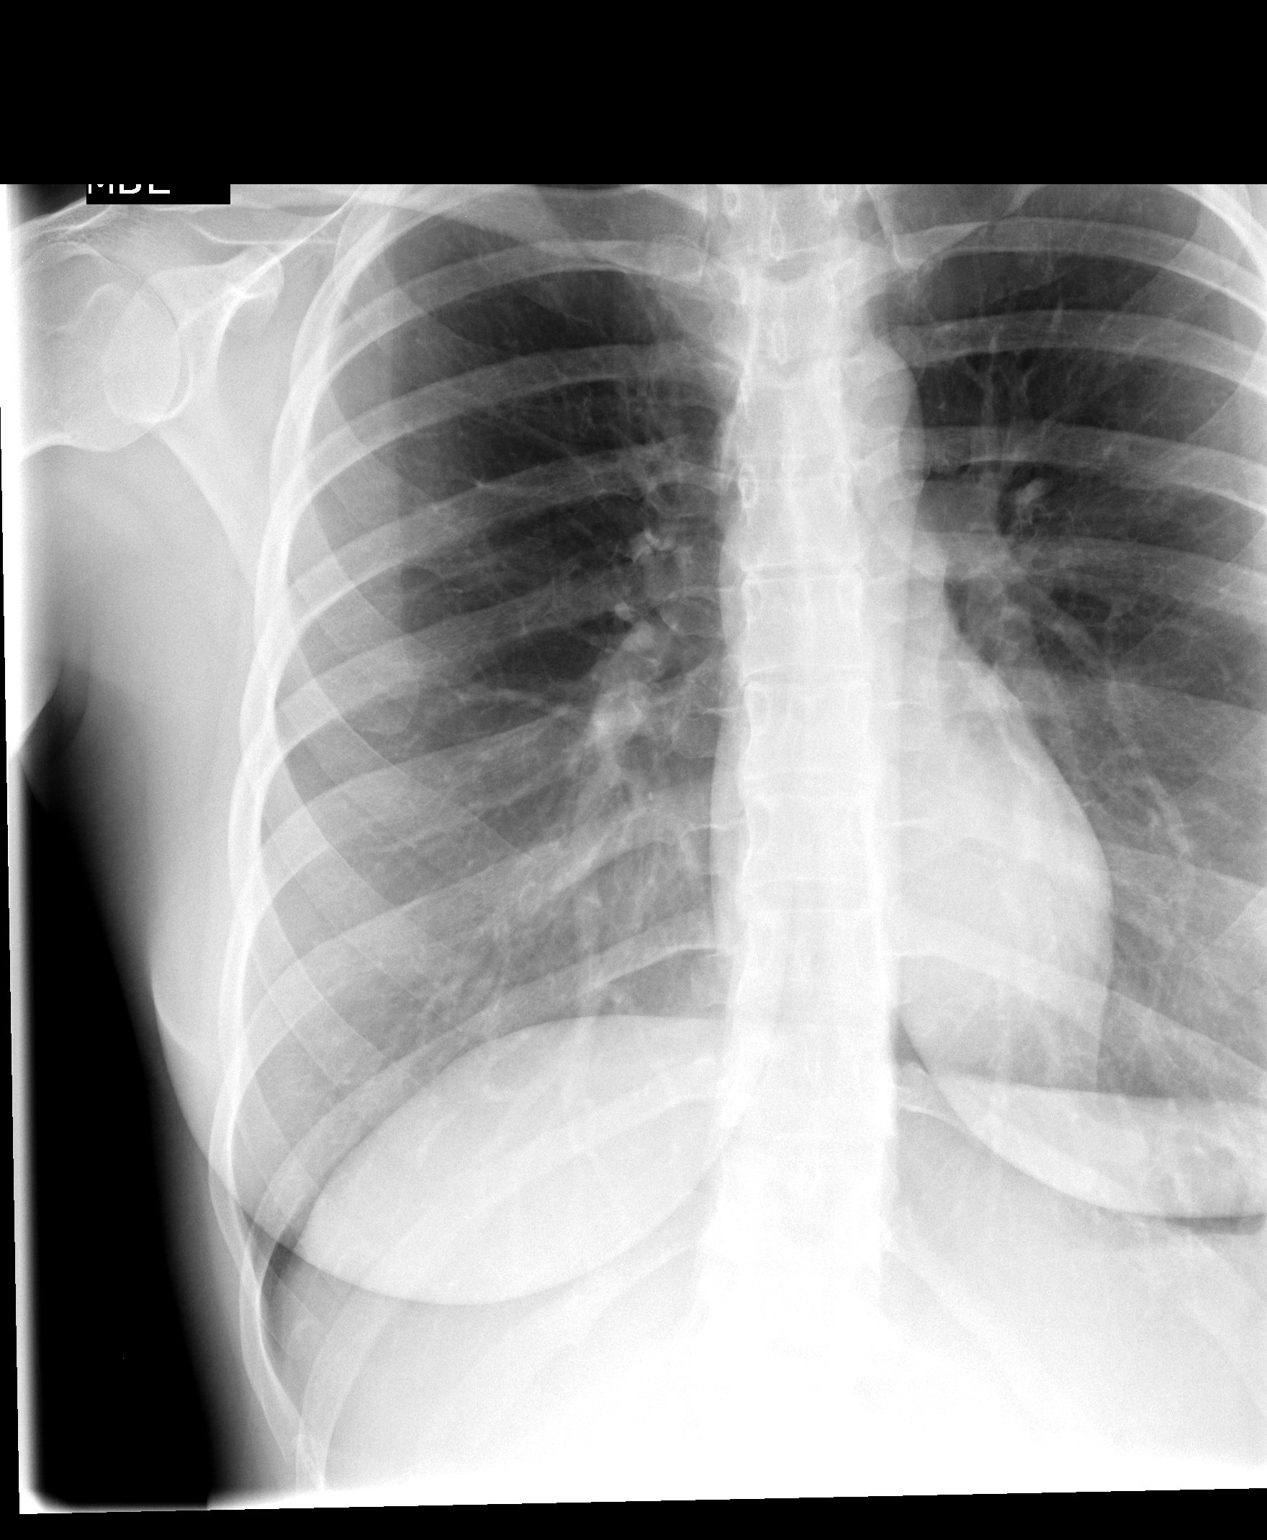

[view not recorded (2 of 3)]
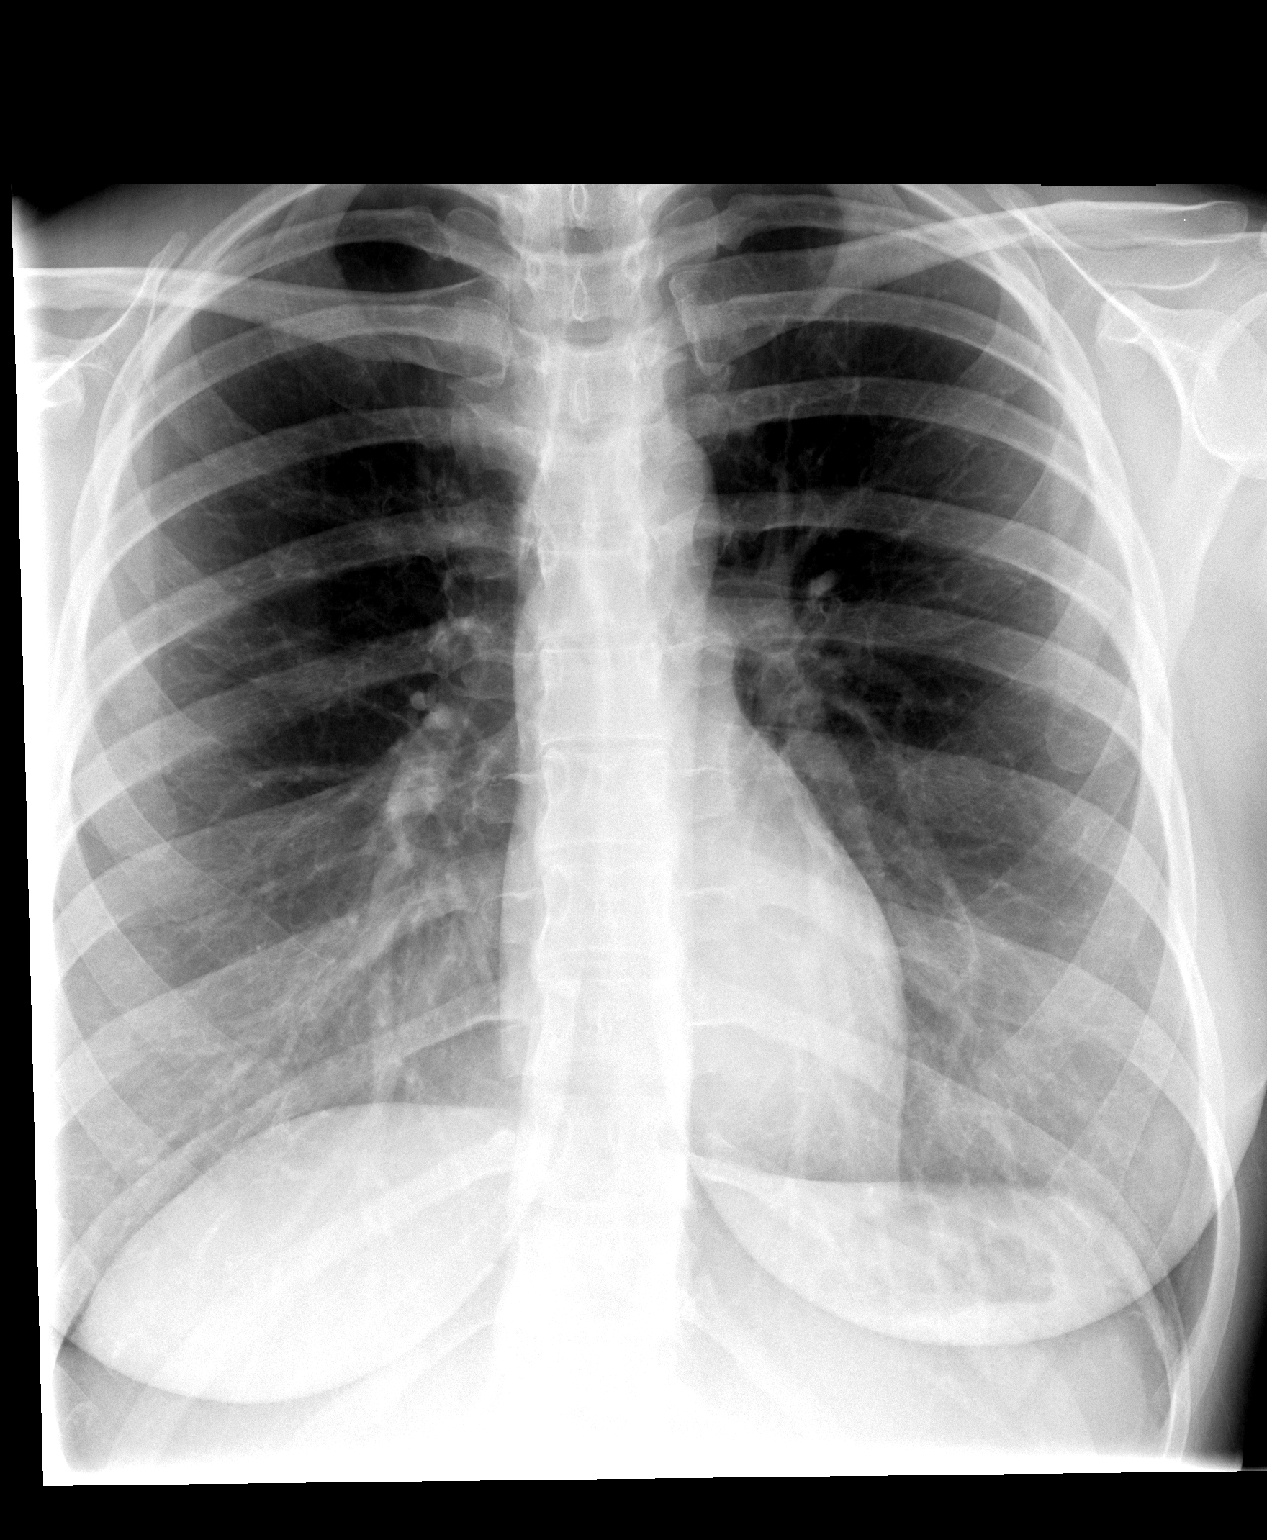

[view not recorded (3 of 3)]
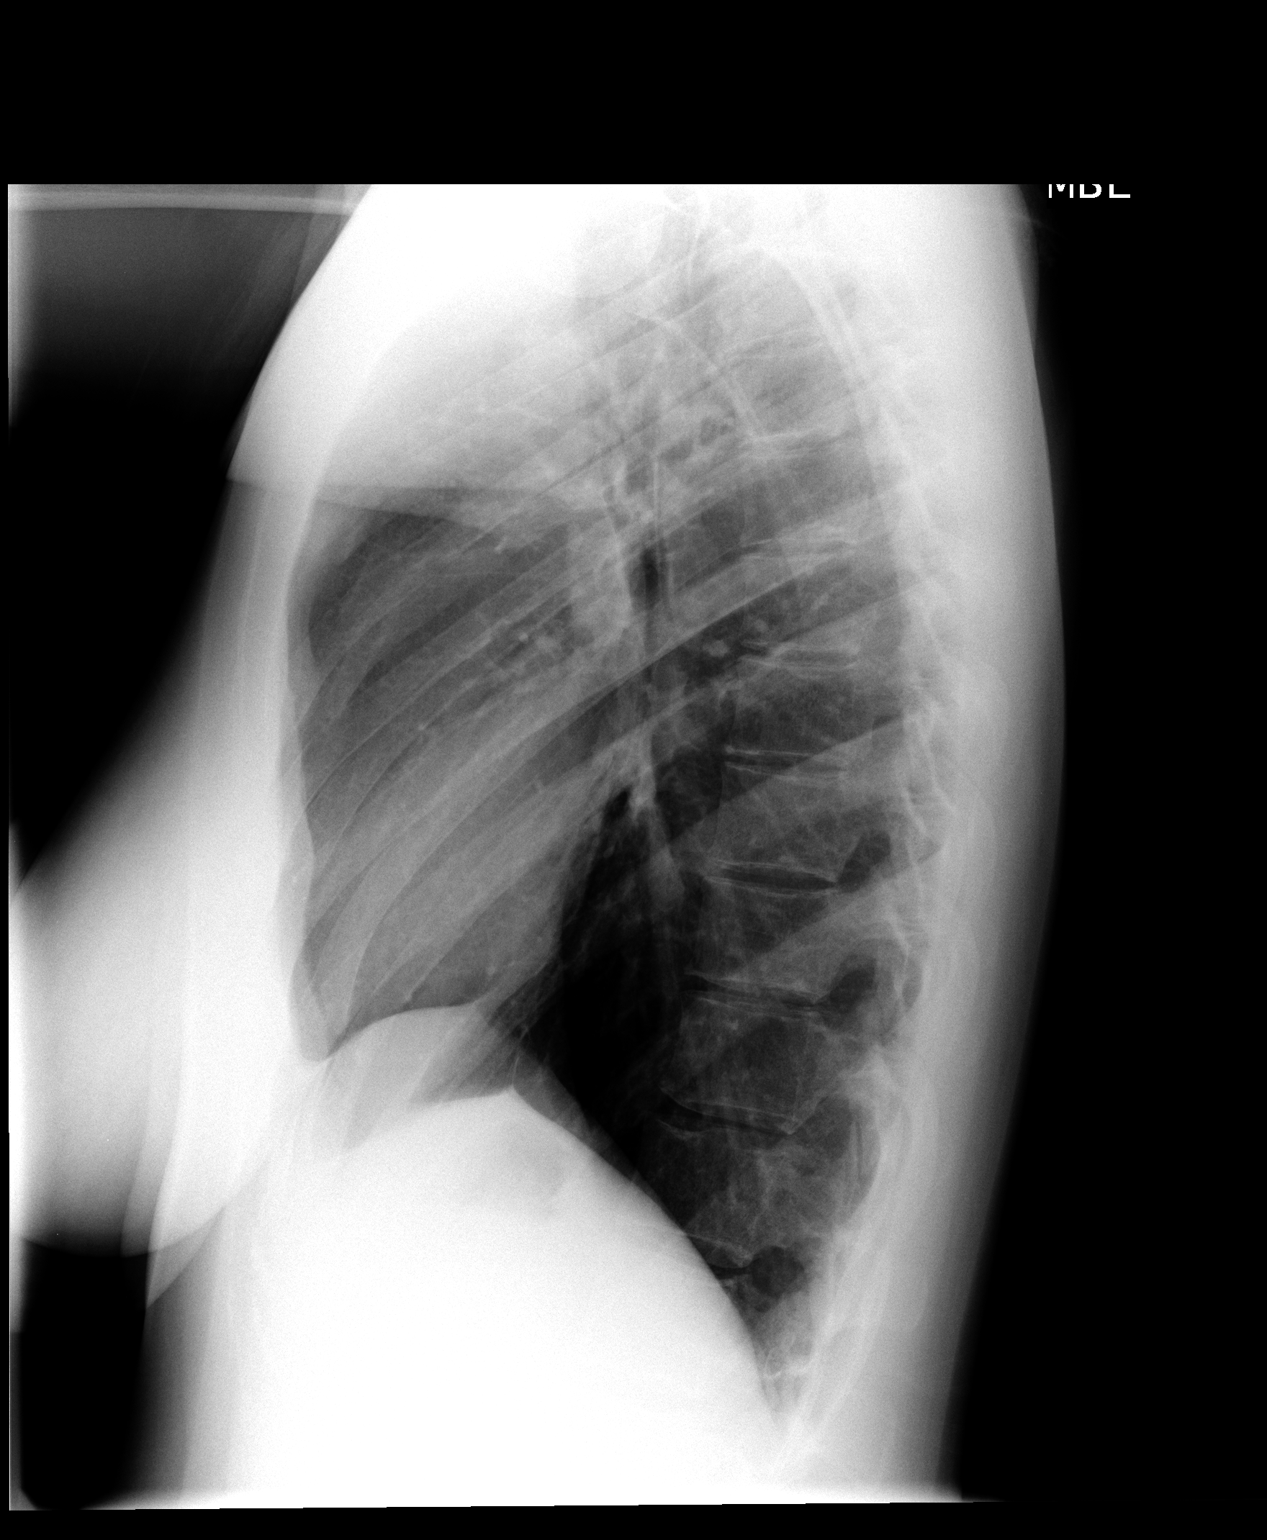

[3 of 3 positions shown; findings below may reference images not displayed]

FINDINGS: Stable and normal lung volumes. Normal cardiac size and
mediastinal contours.  Visualized tracheal air column is within
normal limits.  The lungs remain clear.  No pneumothorax or
effusion.  Very mild scoliosis. No acute osseous abnormality
identified.
IMPRESSION: Negative, no acute cardiopulmonary abnormality.

## 2017-01-27 ENCOUNTER — Encounter: Payer: Self-pay | Admitting: Family Medicine
